# Patient Record
Sex: Male | Born: 2000 | Race: White | Hispanic: No | Marital: Single | Smoking: Never smoker
Health system: Southern US, Community
[De-identification: ages and names within clinical notes are randomized; demographics above are authoritative.]

## PROBLEM LIST (undated history)

## (undated) DIAGNOSIS — J309 Allergic rhinitis, unspecified: Secondary | ICD-10-CM

## (undated) DIAGNOSIS — T7840XA Allergy, unspecified, initial encounter: Secondary | ICD-10-CM

## (undated) DIAGNOSIS — J45909 Unspecified asthma, uncomplicated: Secondary | ICD-10-CM

## (undated) HISTORY — PX: HX WISDOM TEETH EXTRACTION: SHX21

## (undated) HISTORY — DX: Allergy, unspecified, initial encounter: T78.40XA

## (undated) HISTORY — DX: Allergic rhinitis, unspecified: J30.9

## (undated) HISTORY — PX: FINGER SURGERY: SHX640

## (undated) HISTORY — PX: WISDOM TOOTH EXTRACTION: SHX21

## (undated) HISTORY — DX: Unspecified asthma, uncomplicated: J45.909

---
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
## 2022-01-31 ENCOUNTER — Other Ambulatory Visit: Payer: Self-pay

## 2022-01-31 ENCOUNTER — Encounter (INDEPENDENT_AMBULATORY_CARE_PROVIDER_SITE_OTHER): Payer: Self-pay | Admitting: Otolaryngology

## 2022-01-31 ENCOUNTER — Ambulatory Visit (INDEPENDENT_AMBULATORY_CARE_PROVIDER_SITE_OTHER): Payer: BC Managed Care – PPO | Admitting: Otolaryngology

## 2022-01-31 VITALS — BP 122/62 | HR 80 | Temp 96.9°F | Ht 71.26 in | Wt 203.3 lb

## 2022-01-31 DIAGNOSIS — J309 Allergic rhinitis, unspecified: Secondary | ICD-10-CM

## 2022-01-31 DIAGNOSIS — J343 Hypertrophy of nasal turbinates: Secondary | ICD-10-CM

## 2022-01-31 NOTE — Procedures (Signed)
ENT, [facility] ENT  [place] [id number]  [facility]  Procedure Note    Name: Tyler Navarro MRN:  [id number]   Date: 01/31/2022 Age: 21 y.o.       Procedures    Barbette Merino Eh Sauseda, MD

## 2022-01-31 NOTE — Patient Instructions (Signed)
Patient Appointment Instructions:    During your visit, we will be testing for the most common inhalant allergies in our area which include Pollens, Cat, Dog, Dustmites, Molds, and Cockroach.  After your visit, we will be able to  tell you what you are allergic to and how sensitive you are to the allergens.  Please do the following:     Eat a light meal before testing.  Wear a sleeveless shirt for testing.  The test will be performed on both arms.   Do not wear perfume, cologne, aftershave,  or heavily scented deodorant on the day of the test.  Notify us before the test if you are planning to become pregnant.   STOP these medications prior to allergy testing or you will test negative:  Oral Antihistamines - Stop taking 7 days prior to testing.  Examples:  Allegra (Fexofenadine), Claritin (Loratadine), Clarinex (Desioratidine), Zyrtec (Cetirizine), Xyzal, Benadryl (Diphenhydramind), Hydroxyzine (atarax, Vistaril), Chlorpheniramine, Perlactin, Lodrane, Actifed, Dimetapp, Tavist, Phenergan, Nyquil, Alaver, etc.  Antihistamines are found in many over-the-counter cough/cold/allergy/sleep aid medications.  Read the label carefully.  Anti-Nausea and Anti-Vertigo - Stop taking 3 days prior to testing.   Examples:  Compazine, Meclizine, and Phenergan   Antihistamine nasal sprays -  Stop taking 3 days prior to testing.  Examples:  Astelin, Astepro, Pantanase, Dymista  Antihistamine Eye Drops -  Stop taking 3 days prior to testing.  Examples:  Optivar, Pataday, Patanol, Zaditor.  Tricyclic Antidepressants, Psychotropics, and MAO Inhibitors -  Stop taking 1-2 weeks prior to testing.  Important to first check with prescribing doctor before discontinuing these medications.  Examples:  Doxepin, Clomipramine, Amitriptyline, Elavil, Imipramine, Nortriptyline, Remeron, Trazodone, Timipamine, Amoxapine, Serzone, Desipramine, Protriptyline, Thorazine, Haldol, Seroquel, Cogentin, Risperdal.  DO NOT TO STOP THESE MEDICATIONS:  Prednisone  and other steroids  Singulair  Nasal corticosteroid sprays (Flonase, Veramyst, Nasonex, Rhinocort, Beconase, etc.  Asthma medications and inhalers  Afrin, Sudafed, Atrovent nasal spray, Mucinex, Dextromethorphan  Birth control pills and hormones  Antibiotics  H2 antagonists (Zantac, Tagamet, Pepcid, Axid, etc.)  Aspirin  IF YOU ARE NOT SURE ABOUT A DRUG, PLEASE CALL AND ASK BEFORE YOU TAKE IT.  Testing CANNOT be performed if you have any of the following conditions:  You have a sunburn, rash on the arms, or if you are ill  You have chest tightness or wheezing the day of your test  You are taking beta-blockers.  These are medications used to treat high blood pressure, heart conditions, migraines, glaucoma, and other problems.  Examples:  Atenolol, Toprol, Metoprolol, Lopressor, Timoptic, Coreq, Inderal, Propanolol, Betapace, Tenormin, Ziac, Nadolol, Bystolle, etc.  Allow approximately 1-2 hours for allergy testing.  If you are running late, please contact the clinic.  You may need to be rescheduled.  If you have any questions, please call us at [phone].

## 2022-01-31 NOTE — H&P (Signed)
ENT, [facility] E[facility]  [address]  [place] [address]  [phone]    PATIENT NAME:  Tyler Navarro  MRN:  [id number]  DOB:  Oct 04, 2001  DATE OF SERVICE: 01/31/2022    Chief Complaint:  Allergies      HPI:  Tyler Navarro is a 21 y.o. boy, who just started as freshman here at [employer]. He moved from [place], where he had significant allergies for which he had been on allergy shots for 3 years. He had to DC them here in January and he is in here resuming his allergy shots. He does report issues with sinuses but not fairly often. At the present, he is on Allegra, Zyrtec, and Singulair, but not sprays.     Past Medical History:  Past Medical History:   Diagnosis Date   . Allergic rhinitis    . Allergy            Past Surgical History:  Past Surgical History:   Procedure Laterality Date   . FINGER SURGERY     . HX WISDOM TEETH EXTRACTION             Family History:  Family Medical History:       Problem Relation (Age of Onset)    Cancer Maternal Grandmother, Maternal Grandfather, Paternal 60, Paternal Grandfather    Diabetes Father, Sister              Social History:  Social History     Tobacco Use   Smoking Status Never   Smokeless Tobacco Never     Social History     Substance and Sexual Activity   Alcohol Use Yes     Social History     Occupational History   . Not on file       Medications:  Outpatient Medications Marked as Taking for the 01/31/22 encounter (Office Visit) with Leveon Pelzer, Barbette Merino, MD   Medication Sig   . cetirizine (ZYRTEC) 10 mg Oral Tablet Take 1 Tablet (10 mg total) by mouth Once a day   . fexofenadine (ALLEGRA) 180 mg Oral Tablet Take 1 Tablet (180 mg total) by mouth Once a day   . montelukast (SINGULAIR) 10 mg Oral Tablet Take 1 Tablet (10 mg total) by mouth Every evening       Allergies:  No Known Allergies    Review of Systems:  Do you have any fevers: no   Any weight change: no   Change in your vision: no    Chest Pain: no   Shortness of Breath: yes   Stomach  pain: no   Urinary difficulity: no   Joint Pain: no   Skin Problems: no   Weakness or Numbness: no   Easy Bruising or Bleeding: no   Excessive Thirst: no   Seasonal Allergies: yes Explain Seasonal Allergies: spring  nasal congestion  All other systems reviewed and found to be negative.    Physical Exam:  Blood pressure 122/62, pulse 80, temperature 36.1 C (96.9 F), height 1.81 m (5' 11.26"), weight 92.2 kg (203 lb 4.2 oz), SpO2 97 %.  Body mass index is 28.14 kg/m.  General Appearance: Pleasant, cooperative, healthy, and in no acute distress.  Eyes: Conjunctivae/corneas clear.  Head and Face: Face symmetric, no obvious lesions.   External auditory canals:  Patent without inflammation.  Tympanic membranes:  Intact, translucent, midposition, middle ear aerated.  Nose:  See procedure note.  Oral Cavity/Oropharynx: No mucosal lesions, masses, or pharyngeal asymmetry.  Procedure:   Nasal endoscopy after time-out was performed. He does have bilaterally enlarged inferior turbinates. Some crusting and secretions were noted on the right side.     Data Reviewed:  N/A    Assessment:      ICD-10-CM    1. Allergic rhinitis  J30.9 31231 - NASAL ENDOSCOPY DIAGNOSTIC UNILATERAL OR BILATERAL (AMB ONLY)     AMB CONSULT/REFERRAL MENT ONLY - ALLERGY TESTING      2. Nasal turbinate hypertrophy  J34.3 31231 - NASAL ENDOSCOPY DIAGNOSTIC UNILATERAL OR BILATERAL (AMB ONLY)     AMB CONSULT/REFERRAL MENT ONLY - ALLERGY TESTING            Plan:  We will proceed with allergy testing. He is to use steroid spray as well, and if symptoms really worsen, we can proceed with a DepoMedrol injection once he is allergy tested. SNOT equals 19.      Barbette Merino Merary Garguilo, MD

## 2022-02-16 ENCOUNTER — Emergency Department (HOSPITAL_COMMUNITY): Payer: BC Managed Care – PPO

## 2022-02-16 ENCOUNTER — Ambulatory Visit (INDEPENDENT_AMBULATORY_CARE_PROVIDER_SITE_OTHER): Payer: Self-pay | Admitting: OTOLARYNGOLOGY

## 2022-02-16 ENCOUNTER — Ambulatory Visit (INDEPENDENT_AMBULATORY_CARE_PROVIDER_SITE_OTHER): Payer: BC Managed Care – PPO

## 2022-02-16 ENCOUNTER — Other Ambulatory Visit: Payer: Self-pay

## 2022-02-16 ENCOUNTER — Encounter (HOSPITAL_COMMUNITY): Payer: Self-pay

## 2022-02-16 ENCOUNTER — Emergency Department
Admission: EM | Admit: 2022-02-16 | Discharge: 2022-02-16 | Disposition: A | Discharge status: Home / Self Care | Payer: BC Managed Care – PPO | Attending: Emergency Medicine | Admitting: Emergency Medicine

## 2022-02-16 DIAGNOSIS — R000 Tachycardia, unspecified: Secondary | ICD-10-CM | POA: Insufficient documentation

## 2022-02-16 DIAGNOSIS — R21 Rash and other nonspecific skin eruption: Secondary | ICD-10-CM | POA: Insufficient documentation

## 2022-02-16 DIAGNOSIS — J343 Hypertrophy of nasal turbinates: Secondary | ICD-10-CM

## 2022-02-16 DIAGNOSIS — R059 Cough, unspecified: Secondary | ICD-10-CM | POA: Insufficient documentation

## 2022-02-16 DIAGNOSIS — J309 Allergic rhinitis, unspecified: Secondary | ICD-10-CM

## 2022-02-16 DIAGNOSIS — T7840XA Allergy, unspecified, initial encounter: Secondary | ICD-10-CM | POA: Insufficient documentation

## 2022-02-16 MED ORDER — IPRATROPIUM 0.5 MG-ALBUTEROL 3 MG (2.5 MG BASE)/3 ML NEBULIZATION SOLN
3.0000 mL | INHALATION_SOLUTION | RESPIRATORY_TRACT | Status: AC
Start: 2022-02-16 — End: 2022-02-16
  Administered 2022-02-16: 3 mL via RESPIRATORY_TRACT

## 2022-02-16 MED ORDER — DEXAMETHASONE SODIUM PHOSPHATE (PF) 10 MG/ML INJECTION SOLUTION
10.0000 mg | INTRAMUSCULAR | Status: AC
Start: 2022-02-16 — End: 2022-02-16
  Administered 2022-02-16: 10 mg via INTRAVENOUS
  Filled 2022-02-16: qty 1

## 2022-02-16 MED ORDER — EPINEPHRINE 0.3 MG/0.3 ML INJECTION, AUTO-INJECTOR
0.3000 mg | AUTO-INJECTOR | Freq: Once | INTRAMUSCULAR | 0 refills | Status: DC | PRN
Start: 2022-02-16 — End: 2022-03-07

## 2022-02-16 MED ORDER — FAMOTIDINE (PF) 20 MG/2 ML INTRAVENOUS SOLUTION
20.0000 mg | INTRAVENOUS | Status: AC
Start: 2022-02-16 — End: 2022-02-16
  Administered 2022-02-16: 20 mg via INTRAVENOUS
  Filled 2022-02-16: qty 2

## 2022-02-16 MED ORDER — DIPHENHYDRAMINE 50 MG/ML INJECTION SOLUTION
25.0000 mg | INTRAMUSCULAR | Status: AC
Start: 2022-02-16 — End: 2022-02-16
  Administered 2022-02-16: 25 mg via INTRAVENOUS
  Filled 2022-02-16: qty 1

## 2022-02-16 NOTE — Discharge Instructions (Signed)
Please return to the ED for any new or worsening symptoms, particularly worsening rash, difficulty breathing, facial swelling, vomiting, or any other concerning symptoms.

## 2022-02-16 NOTE — ED Nurses Note (Signed)
Verbal report given to RN. Care of patient transferred at this time.

## 2022-02-16 NOTE — ED Nurses Note (Signed)
Security called to transport pt to [address] to get pt car at allergy center.

## 2022-02-16 NOTE — Telephone Encounter (Signed)
Pt seen in clinic today for allergy testing. After second round of allergy testing, pt appeared to have hives on trunk. LPN informed RN. RN auscultated lungs at 220pm during which patient was noted to have wheezing in the upper left lobe.     Dr. Katherine Basset informed and VS taken. VS 116/78, 97% O2, HR 89. Dr. Katherine Basset ordered Epi 0.3mg /0.82ml to be administered. Epi administered by RN IM in the upper right deltoid. Pt tolerated administration.     At 230pm, VS rechecked. HR 136/70, HR 97, Oxygen 99%. Pt monitored in clinic by RN and physician. No worsening symptoms noted. 911 called.    EMS arrived on scene 233. BP 124/80.    Pt transported from scene 240pm. Care transferred.

## 2022-02-16 NOTE — ED Attending Note (Signed)
I saw and examined the patient.  I directly supervised the patient's care.  I discussed the patient's care with the resident/APP and I agree with the findings, diagnosis, and plan as documented in the primary note.  Any exceptions, additions, or corrections are noted below.    MDM - re-evaluated pt, reviewed relevant old records, hemo monitoring    Problems considered: cardiovascular, infectious, inflammatory, neurologic, gastrointestinal, immune, pharmacologic, endocrine    Allergic reaction without dyspnea, airway swelling, vomiting  ED obs  No recurrent sx  OK for DC, supportive care, ret inst

## 2022-02-16 NOTE — Progress Notes (Cosign Needed)
02/16/22 1400   Allergy Nurses Notes   Patient Denies Use Of Antihistamine;Beta Blockers;Steroids;Antidepressants   Patient tolerated procedure well   (Seen RN note.)   Benadryl given PO after skin testing for itching and redness   (no)   Hydrocortisone cream applied to testing area yes   Initials ss      02/16/22 1400   BATTERY A   Testing Site A Left forearm   Initial/Retest Initial   Histamine (mm) 9 mm   Dustmite Farinae (mm) 0 mm   Cat (mm) 0 mm   Dog (mm) 0 mm   Cockroach (mm) 5 mm   Alternaria (mm) 5 mm   Cladosporium Sphaerospermum (mm) 0 mm   Saline (mm) 0 mm   Battery A Single/Multiple: 6   BATTERY B   Testing Site B Left forearm   Initial/Retest Initial   Elm (mm) 9 mm   Red Oak (mm) 9 mm   American Sycamore (mm) 0 mm   Timothy Grass (mm) 7 mm   Johnson Grass (mm) 7 mm   Ragweed (mm) 5 mm   English Plantain (mm) 7 mm   Dustmite Pteronyssinus (mm) 5 mm   Battery B Single/Multiple: 8   BATTERY C   Testing Site C Right forearm   Initial/Retest Initial   Birch Red (mm) 9 mm   Maple (mm) 9 mm   Guatemala Grass (mm) 9 mm   Lamb Quarters (mm) 7 mm   Rough Pigweed (mm) 7 mm   Bipolaris Sorokiniana (mm) 5 mm   Aspergillus (mm) 0 mm   Penicillium (mm) 0 mm   Initials ss   Battery C Single/Multiple: 8      02/16/22 1400   Site    Testing site 1 Left Side   Ragweed   Ragweed Dilution 6 5   Ragweed Dilution 4 11   TOTAL RAGWEED DILUTION @ 5   Ragweed Total Single/Multiple Stick: 2   Pigweed   Pigweed Dilution 6 5   Pigweed Dilution 4 9   TOTAL PIGWEED DILUTION @ 5   Pigweed Total Single/Multiple Stick: 2   Plantain   Plantain Dilution 6 5   Plantain Dilution 4 9   TOTAL PLANTAIN DILUTION @ 5   Plantain Total Single/Multiple Stick: 2   Lamb's Quarters   Lamb's Quarters Dilution 4 7   Lamb's Quarters Dilution 3 9   TOTAL LAMB'S QUARTERS DILUTION @ 4   Lamb's Quarters Total Single/Multiple Stick: 2   Timothy Grass   Timothy Grass Dilution 6 7   Timothy Grass Dilution 4 11   TOTAL TIMOTHY GRASS DILUTION @ 6   Timothy  Grass Total Single/Multiple Stick: 2   Guatemala Grass   Guatemala Grass Dilution 6 7   Guatemala Grass Dilution 5 9   TOTAL [place] GRASS DILUTION @ 6   Guatemala Grass Total Single/Multiple Stick: 2   Johnson Grass   Johnson Grass Dilution 6 5   Johnson Grass Dilution 4 9   TOTAL JOHNSON GRASS DILUTION @ 5   Johnson Grass Total Single/Multiple Stick: 2   Red Oak   [employer]ant  Dilution 6 5   Red Oak  Dilution 4 9   TOTAL RED OAK DILUTION @ 5   Red Oak Total Single/Multiple Stick: 2   Maple (Box Elder)   Maple Dilution 6 5   Maple Dilution 4 9   TOTAL MAPLE DILUTION @ 5   Maple Total Single/Multiple Stick: 2   RED BIRCH  Red Birch Dilution 6 5   Red Birch Dilution 4 13   TOTAL RED BIRCH DILUTION   @ 5   Red Birch Total Single/Multiple Stick: 2   American Automotive engineer Dilution 6   (Did not get to test, see RN note.)   American Sycamore Dilution 4 9   American Sycamore Dilution 2 11   American Sycamore Total Single/Multiple Stick: 2   Cat   Cat Dilution 2 5   Cat Total Single/Multiple Stick: 1   Dog   Dog Dilution 2 5   Dog Total Single/Multiple Stick: 1   SITE   Testing site 2 Right Side   Dustmite Farinae   Dustmite Farinae Dilution 2 7   Dustmite Farinae Dilution 1 11   TOTAL DUSTMITE FARINAE DILUTION @ 2   Dustmite Farinae Total Single/Multiple Stick: 2   Dustmite Pteronyssinus   Dustmite Pteronyssinus Dilution 4 7   Dustmite Pteronyssinus Dilution 3 9   TOTAL DUSTMITE PTERONYSSINUS DILUTION @ 4   Dustmite Pteronyssinus Total Single/Multiple Stick: 2   Aspergillus   Aspergillus Dilution 2 5   Aspergillus Total Single/Multiple Stick: 1   Alternaria   Alternaria Dilution 4 7   Alternaria Dilution 3 9   TOTAL ALTERNARIA DILUTION @ 2   Alternaria Total Single/Multiple Stick: 2   Cladosporium Sphaerospermum   Cladosporium Sphaerospermum Dilution 2 5   Cladosporium Sphaerospermum Total Single/Multiple Stick: 1   Penicillium   Penicillium Dilution 2 5   Penicillium Total Single/Multiple Stick: 1   Bipolaris  Sorokinina   Bipolaris Sorokiniana Dilution 4 5   Bipolaris Sorokiniana Dilution 2 9   TOTAL BIPOLARIS SOROKINIANA  @3    Bipolaris Sorokiniana Total Single/Multiple Stick: 2   Cockroach   Cockroach Dilution 4 5   Cockroach Dilution 2 7   Cockroach Dilution 1   (Did not get to test, see RN note.)   Cockroach Total Single/Multiple Stick: 2   Glycerin   Glycerin Class 2   (#1 - 7 , #2 - 5 , #3 - 5)   Initials   Initials ss     See RN note.   Dr.Ramadan to be made aware of test results and patient's reaction.    Camie Patience, LPN  [id number], [id number]    Patient started to develop anaphylaxis after testing, so epinephrine was administered at my direction (see Nursing note for more details.). EMS was called and patient transported to the ED.    Francia Greaves, MD

## 2022-02-16 NOTE — ED Nurses Note (Signed)
Pt arrived to ED 30 via EMS after receiving an allergy panel and started to break out in hives on arms and chest. Pt denies face, tongue, or throat swelling at that time. Pt endorses cough that started while receiving the allergy testing. Pt received 0.3mg  of epi at facility pta at 1425. Pt states the hives have gone down compared to early. No hives noted on chest upon assessment. Pt denies chest pain and SOB. ED provider at bedside.

## 2022-02-16 NOTE — ED Provider Notes (Signed)
[facility] - Emergency Department  ED Primary Provider Note  History of Present Illness   Chief Complaint   Patient presents with   . Allergic reaction     Was receiving allergy panel and started to break out in hives. Received 0.3mg  epi in right arm at 1425 by facility staff pta  Wheezing was noted by EMS     Tyler Navarro is a 21 y.o. male who had concerns including Allergic reaction.  Arrival: The patient arrived by Ambulance    HPI     Patient reports he was receiving an allergy panel today at around 13:00 today when he had onset of hives to his abdomen, chest, and upper extremities at around 14:00. He reports he was advised to discontinue his daily medications for one week prior to the study. Pt states his daily medications include allegra, zyrtec, and Singulair. He reports that throughout that the week, he has noticed a mild rash to his chest as well as a cough. He notes his rash worsened significantly once he began the testing. Patient reports he does not have an epi pen at home and denies any history of anaphylactic reactions. Per charting, patient received 0.3 of epi at 14:30. At this time, pt reports he feels his heart racing, but denies any SOB, chest tightness, abdominal pain, nausea, vomiting, diarrhea, face swelling, or other complaints or concerns at this time.    Pertinent positive and negative ROS as per HPI.  Historical Data   History Reviewed This Encounter:      Physical Exam   ED Triage Vitals [02/16/22 1501]   BP (Non-Invasive) (!) 170/90   Heart Rate 94   Respiratory Rate 18   Temperature 37.1 C (98.8 F)   SpO2 100 %   Weight    Height      Physical Exam  Vitals and nursing note reviewed.   Constitutional:       General: He is not in acute distress.     Appearance: He is well-developed.   HENT:      Head: Normocephalic and atraumatic.   Eyes:      Conjunctiva/sclera: Conjunctivae normal.   Cardiovascular:      Rate and Rhythm: Normal rate and regular rhythm.      Heart  sounds: No murmur heard.  Pulmonary:      Effort: Pulmonary effort is normal. No respiratory distress.      Comments: Trace end-expiratory wheezing  Abdominal:      Palpations: Abdomen is soft.      Tenderness: There is no abdominal tenderness.   Musculoskeletal:         General: No swelling.      Cervical back: Neck supple.   Skin:     General: Skin is warm and dry.      Capillary Refill: Capillary refill takes less than 2 seconds.   Neurological:      Mental Status: He is alert.   Psychiatric:         Mood and Affect: Mood normal.       Patient Data   Labs Ordered/Reviewed - No data to display  No orders to display     Medical Decision Making        Medical Decision Making  Allergic reaction, initial encounter: acute illness or injury  Risk  Prescription drug management.        ED Course as of 02/17/22 0003   Thu Feb 16, 2022   1513 Patient  presents with rash while receiving allergy test.  States that he had severe redness on his chest but no facial swelling or shortness of breath.  No vomiting or abdominal pain.  He was given epi and had improvement in his rash.  Comfortable in the room at this time.  Did have some trace wheezing bilaterally.  Will treat with breathing treatment as well as steroids, Pepcid, Benadryl and observe for 1 hour   1615 Patient re-evaluated.  Continues to be clinically stable and comfortable in the room.  No shortness of breath.  No recurrent rash.  No abdominal pain or facial swelling.  Appropriate for discharge home.  Two epi pens were prescribed and patient was given instructions on how to use them.  Instructed to Use Benadryl and Pepcid for any further rash and only use EpiPen as necessary.  All questions answered.  Discharged home in stable condition.   1616 BP (Non-Invasive): 127/71   1616 Heart Rate: 93   1616 SpO2: 100 %   1616 Temperature: 37.1 C (98.8 F)         Medications Administered in the ED   famotidine (PEPCID) 10 mg/mL injection (20 mg Intravenous Given 02/16/22 1530)    diphenhydrAMINE (BENADRYL) 50 mg/mL injection (25 mg Intravenous Given 02/16/22 1529)   dexamethasone (PF) 10 mg/mL injection (10 mg Intravenous Given 02/16/22 1530)   ipratropium-albuterol 0.5 mg-3 mg(2.5 mg base)/3 mL Solution for Nebulization (3 mL Nebulization Given 02/16/22 1530)     Clinical Impression   Allergic reaction, initial encounter (Primary)       Disposition: Discharged           I am scribing for, and in the presence of, [name], MD for services provided on 02/17/2022.   [name], SCRIBE    I personally performed the services described in this documentation, as scribed  in my presence, and it is both accurate  and complete.    Irene Pap, MD/  PGY-2 Emergency Medicine  [facility]  02/17/22 00:03

## 2022-02-21 ENCOUNTER — Ambulatory Visit (INDEPENDENT_AMBULATORY_CARE_PROVIDER_SITE_OTHER): Payer: BC Managed Care – PPO | Admitting: Otolaryngology

## 2022-02-21 ENCOUNTER — Other Ambulatory Visit: Payer: Self-pay

## 2022-02-21 VITALS — BP 132/64 | HR 76 | Temp 96.8°F | Ht 71.26 in | Wt 199.3 lb

## 2022-02-21 DIAGNOSIS — J309 Allergic rhinitis, unspecified: Secondary | ICD-10-CM

## 2022-02-21 DIAGNOSIS — J019 Acute sinusitis, unspecified: Secondary | ICD-10-CM

## 2022-02-21 DIAGNOSIS — J343 Hypertrophy of nasal turbinates: Secondary | ICD-10-CM

## 2022-02-21 MED ORDER — CEFDINIR 300 MG CAPSULE
300.0000 mg | ORAL_CAPSULE | Freq: Two times a day (BID) | ORAL | 0 refills | Status: AC
Start: 2022-02-21 — End: 2022-03-03

## 2022-02-21 NOTE — Progress Notes (Signed)
ENT, [facility]  [address]  [place] [address]  [phone]    PATIENT NAME:  [facility]  MRN:  [id number]  DOB:  Jul 26, 2001  DATE OF SERVICE: 02/21/2022    Chief Complaint:  Allergies      HPI:  Tyler [facility]arro is a 21 y.o. male presenting in here today for follow-up on his allergic rhinitis. 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 He did go through testing last time. Apparently he had a significant reaction with rash, for which EpiPen was given, and then sent to the ED for observation whereby no management was done, and he did well. He is in here today. He seems to be doing good. He is stuffy, congested as before.     Medications:  Outpatient Medications Marked as Taking for the 02/21/22 encounter (Office Visit) with Anadelia Kintz, Barbette Merino, MD   Medication Sig   . cefdinir (OMNICEF) 300 mg Oral Capsule Take 1 Capsule (300 mg total) by mouth Twice daily for 10 days   . cetirizine (ZYRTEC) 10 mg Oral Tablet Take 1 Tablet (10 mg total) by mouth Once a day   . fexofenadine (ALLEGRA) 180 mg Oral Tablet Take 1 Tablet (180 mg total) by mouth Once a day   . montelukast (SINGULAIR) 10 mg Oral Tablet Take 1 Tablet (10 mg total) by mouth Every evening       Allergies:  Allergies   Allergen Reactions   . Grass Pollen    . House Dust    . Tree And Shrub Pollen          Physical Exam:  Blood pressure 132/64, pulse 76, temperature 36 C (96.8 F), height 1.81 m (5' 11.26"), weight 90.4 kg (199 lb 4.7 oz), SpO2 97 %.  Body mass index is 27.59 kg/m.  General Appearance: Pleasant, cooperative, healthy, and in no acute distress.  Eyes: Conjunctivae/corneas clear.  Head and Face: Face symmetric, no obvious lesions.   External auditory canals:  Patent without inflammation.  Tympanic membranes:  Intact, translucent, midposition, middle ear aerated.  Nose: I was able to see purulence on the left side.   Oral Cavity/Oropharynx: No mucosal lesions,  masses, or pharyngeal asymmetry.    Procedure:        Data Reviewed:    Testing was reviewed.     Assessment:      ICD-10-CM    1. Allergic rhinitis  J30.9       2. 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 Acute sinusitis  J01.90            Plan:  I did discuss with him risks of shots. He wants to proceed. We are going to proceed gently with him. We are going to proceed with shots at his endpoints rather than doing 2 above, and I am going to exclude Guatemala grass and Sycamore grass for the time being. I am going to give him Omnicef for the sinusitis, and I will see him back for follow-up in 3 months. SNOT equals 26.     Barbette Merino See Beharry, MD

## 2022-02-24 ENCOUNTER — Ambulatory Visit (INDEPENDENT_AMBULATORY_CARE_PROVIDER_SITE_OTHER): Payer: Self-pay | Admitting: Otolaryngology

## 2022-02-24 NOTE — Telephone Encounter (Signed)
I attempted to call patient to discuss allergy injections per Dr. Ramadan. No response. Left message on voicemail for patient to return call at provided phone number.

## 2022-02-27 ENCOUNTER — Ambulatory Visit (INDEPENDENT_AMBULATORY_CARE_PROVIDER_SITE_OTHER): Payer: Self-pay | Admitting: Otolaryngology

## 2022-02-27 NOTE — Telephone Encounter (Signed)
Spoke to patient. Asked if patient would be interested in starting injections. Patient agreeable. Informed patient of Epipen policy. Advised that a valid Epipen would need to be brought to every injections. Also advised that after every injection until concentration is met, patient will have to wait 20 minutes to ensure no negative reactions. Patient agreeable and would like to proceed.     Asked the patient the following questions. Please see responses:    1.) Is the patient on a beta blocker? No  2.) Does the patient have a history of anaphylaxis? Yes- to allergy testing  3.) Does the patient have a history of asthma? If so, is it well controlled? Exercise induced. No issues in years per pt      Will mix patient 03/07/2022.

## 2022-03-02 ENCOUNTER — Other Ambulatory Visit (INDEPENDENT_AMBULATORY_CARE_PROVIDER_SITE_OTHER): Payer: Self-pay | Admitting: Otolaryngology

## 2022-03-02 MED ORDER — EPINEPHRINE 0.3 MG/0.3 ML INJECTION, AUTO-INJECTOR
0.3000 mg | AUTO-INJECTOR | Freq: Once | INTRAMUSCULAR | 2 refills | Status: DC | PRN
Start: 2022-03-02 — End: 2022-03-07

## 2022-03-02 NOTE — Telephone Encounter (Signed)
Regarding: Ramadan  ----- Message from Lars Pinks sent at 03/02/2022  1:32 PM EDT -----  Patient needing his script for EPINEPHrine 0.3 mg/0.3 mL Injection [facility]        [facility]erred [facility]     [facility]   [phone]

## 2022-03-02 NOTE — Telephone Encounter (Signed)
Routed Epipen to Dr. Ramadan.

## 2022-03-06 ENCOUNTER — Other Ambulatory Visit (INDEPENDENT_AMBULATORY_CARE_PROVIDER_SITE_OTHER): Payer: Self-pay | Admitting: Otolaryngology

## 2022-03-06 DIAGNOSIS — J309 Allergic rhinitis, unspecified: Secondary | ICD-10-CM

## 2022-03-06 DIAGNOSIS — J3089 Other allergic rhinitis: Secondary | ICD-10-CM

## 2022-03-07 ENCOUNTER — Other Ambulatory Visit (INDEPENDENT_AMBULATORY_CARE_PROVIDER_SITE_OTHER): Payer: Self-pay | Admitting: Otolaryngology

## 2022-03-07 MED ORDER — EPINEPHRINE 0.3 MG/0.3 ML INJECTION, AUTO-INJECTOR
0.3000 mg | AUTO-INJECTOR | Freq: Once | INTRAMUSCULAR | 2 refills | Status: DC | PRN
Start: 2022-03-07 — End: 2023-11-15

## 2022-03-07 NOTE — Telephone Encounter (Signed)
Script routed to provider for approval.

## 2022-03-07 NOTE — Telephone Encounter (Signed)
Attempted to call patient. No response. Unable to leave VM due to automated message

## 2022-03-07 NOTE — Telephone Encounter (Signed)
Regarding: [facility]  ----- Message from Fredderick Severance sent at 03/07/2022  1:18 PM EDT -----  Pt called and his script//EPINEPHrine 0.3 mg/0.3 mL Injection Auto-Injector was called into [place] pharmacy// His Inj is on Thursday      [id number] [address] [place] [address]  Phone: [phone] Fax: [phone]  Hours: Not open 24 hours        ----- Message from Namon Cirri sent at 03/02/2022  1:32 PM EDT -----  Patient needing his script for EPINEPHrine 0.3 mg/0.3 mL Injection [facility].        Preferred Pharmacy     [facility]   [phone]

## 2022-03-08 ENCOUNTER — Other Ambulatory Visit (INDEPENDENT_AMBULATORY_CARE_PROVIDER_SITE_OTHER): Payer: BC Managed Care – PPO | Admitting: Otolaryngology

## 2022-03-08 DIAGNOSIS — J301 Allergic rhinitis due to pollen: Secondary | ICD-10-CM

## 2022-03-08 DIAGNOSIS — J3089 Other allergic rhinitis: Secondary | ICD-10-CM

## 2022-03-09 ENCOUNTER — Ambulatory Visit (INDEPENDENT_AMBULATORY_CARE_PROVIDER_SITE_OTHER): Payer: BC Managed Care – PPO

## 2022-03-09 ENCOUNTER — Other Ambulatory Visit: Payer: Self-pay

## 2022-03-09 DIAGNOSIS — Z516 Encounter for desensitization to allergens: Secondary | ICD-10-CM

## 2022-03-09 DIAGNOSIS — J301 Allergic rhinitis due to pollen: Secondary | ICD-10-CM | POA: Insufficient documentation

## 2022-03-09 NOTE — Nursing Note (Signed)
03/09/22 1000   Mixed Vials   Comments Pt did not have epipen. [facility]ated [facility] 6pm tongiht. Pt made aware epipen must be present to administer injections. Pt will come back next thursday.

## 2022-03-09 NOTE — Nursing Note (Signed)
03/08/22 1136   ENT Immunotherapy Vial Preparation Flowsheet   Allergy Test Date 02/16/22   Immunotherapy Start Date   (not started yet)   Type of Immunotherapy? Subcutaneous   Managing Physician Imad Shostak   Initials cs   Vial #1   Preparation Date 03/08/22   Expiration Date 06/07/22   Immunotherapy Status No escalation   Diagnosis Allergic Rhinitis due to pollen (J30.1)   Ragweed, Short 1:20 W/V Lavella Hammock)   Ragweed, Short Initial Endpoint 5   Dilution (10% Glycerin) Used 5   Volume of Dilution Used 0.39ml   Pigweed, Rough/Redroot 1:20 W/V Lavella Hammock)   Pigweed, Rough/Redroot Initial Endpoint 5   Dilution (10% Glycerin) Used 5   Volume of Dilution Used 0.5ml   Plantain, English 1:20 W/V Lavella Hammock)   Plantain, English Initial Endpoint 5   Dilution (10% Glycerin) Used 5   Volume of Dilution Used 0.53ml   Lambs Quarter 1:20 W/V (Greer)   Lambs Quarter Initial Endpoint 4   Dilution (10% Glycerin) Used 4   Volume of Dilution Used 0.38ml   Timothy Grass 100,000 BAU/ML Lavella Hammock)   Timothy Grass Initial Endpoint 6   Dilution (10% Glycerin) Used 6   Volume of Dilution Used 0.86ml   Guatemala Grass 10,000 BAU/ML Lavella Hammock)   Guatemala Grass Initial Endpoint 6   Dilution (10% Glycerin) Used 6   Volume of Dilution Used 0.29ml   Johnson Grass 1:20 W/V Lavella Hammock)   Johnson Grass Initial Endpoint 5   Dilution (10% Glycerin) Used 5   Volume of Dilution Used 0.71ml   Red Oak 1:20 W/V (Greer)   Red Oak Initial Endpoint 5   Dilution (10% Glycerin) Used 5    Volume of Dilution Used 0.62ml   Box Elder 1:20 W/V Lavella Hammock)   Box Elder Initial Endpoint 5   Dilution (10% Glycerin) Used 5    Volume of Dilution Used 0.75ml   Birch, River 1:20 W/V (Greer)   Wimbledon, River Initial Endpoint 5   Dilution (10% Glycerin) Used 5    Volume of Dilution Used 0.35ml   Sycamore, American Eastern 1:20 W/V (Greer)   [place], American Russian Federation Initial Endpoint 5   Dilution (10% Glycerin) Used 5   Volume of Dilution Used 0.69ml   OTHER   Antigen Volume 2.2   Diluent (Normal Saline)  Volume 2.8   Total Volume 65ml   Antigen Volume 0.8   Diluent (Normal Saline) Volume 4.2   Total Volume 30ml   Vial #2   Preparation Date 03/08/22   Expiration Date 06/07/22   Immunotherapy Status No escalation   Diagnosis Allergic Rhinitis due to dustmite (J30.89);Allergic Rhinitis due to mold (J30.89)   Dustmite Farinae  10,000 AU/ML (GREER)   Dustmite Farinae Initial Endpoint 2    Dilution (10% Glycerin) Used 2   Volume of Dilution Used 0.71ml   Dustmite Pteronyssinus 10,000 AU/ML Lavella Hammock)   Dustmite Pteronyssinus Initial Endpoint 4    Dilution (10% Glycerin) Used 4   Volume of Dilution Used 0.39ml   Alternaria 1:20 W/V (Greer)   Alternaria Initial Endpoint 4    Dilution (10% Glycerin) Used 4   Volume of Dilution Used 0.15ml   Bipoloris Sorokiniana 1:20 W/V (Greer)    Bipoloris Sorokiniana Initial Endpoint 3   Dilution (10% Glycerin) Used 3   Volume of Dilution Used 0.39ml     2 (10 unit) vials mixed.

## 2022-03-16 ENCOUNTER — Ambulatory Visit (INDEPENDENT_AMBULATORY_CARE_PROVIDER_SITE_OTHER): Payer: BC Managed Care – PPO

## 2022-03-16 ENCOUNTER — Other Ambulatory Visit: Payer: Self-pay

## 2022-03-16 DIAGNOSIS — Z91038 Other insect allergy status: Secondary | ICD-10-CM

## 2022-03-16 DIAGNOSIS — J3089 Other allergic rhinitis: Secondary | ICD-10-CM

## 2022-03-16 DIAGNOSIS — Z516 Encounter for desensitization to allergens: Secondary | ICD-10-CM

## 2022-03-16 DIAGNOSIS — J301 Allergic rhinitis due to pollen: Secondary | ICD-10-CM

## 2022-03-16 NOTE — Nursing Note (Signed)
03/16/22 1000 03/16/22 1028   Mixed Vials   Allergy Vial #1 03/16/22  --    Test Wheal Vial #1 wheal  --    Comments 18mm  (mixed [id number])  --    Allergy Vial #2  --  03/16/22   Test Ames Coupe Vial #2  --  wheal   Comments  --  2mm   Comments NAV  --    Allergy Injection Flowsheet   Date of Injection 03/16/22 03/16/22   Any Illness/ Fever in Last 24 hours?* No No   Any Change in Medication? No No   Is the patient on a beta blocker? No No   Does the patient have a valid EpiPen at visit? Yes Yes   Does patient have a history of Asthma? 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 No No   VIAL   Comments  --  10/2023       I have reviewed the above allergy vial safety test, give injection.  Pincus Sanes, APRN,NP-C  Pincus Sanes, APRN,NP-C  03/16/2022, 15:09

## 2022-03-17 ENCOUNTER — Ambulatory Visit (INDEPENDENT_AMBULATORY_CARE_PROVIDER_SITE_OTHER): Payer: Self-pay | Admitting: Otolaryngology

## 2022-03-17 NOTE — Telephone Encounter (Signed)
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 He said he spoke with someone back home who is willing to do the injections. I said I would check with you and he was going to provide all the information when he comes back in next week.     Cristy       Pt to bring in information next week. OK to send vials to outside clinic. Pt must come back to clinic for vial test on NAVs.

## 2022-03-20 ENCOUNTER — Telehealth: Payer: Self-pay

## 2022-03-20 NOTE — Telephone Encounter (Signed)
Just a FYI. ?Patient's mom called to see if we would be willing to let Tracy [name] bring his vials that had to be made with a different allergy provider near his school. Patient was unable to successfully get his allergy shots we sent with him @ the school student [facility] due to them closing down. The practice near his school wouldn't take our vials, so Tracy [name] had to re do his allergy testing and allow their office to make his new vials. He did test negative to things that he used to be allergic to so the vials have changed some.  ? ?I requested [name] to have their office send over a form for our office to fill out if need be, to allow our office to do the injections. I also requested we have his shot records/ingredients in the vials.  ?

## 2022-03-21 NOTE — Telephone Encounter (Signed)
No problem at all.  Kamya Watling, MD [facility]  

## 2022-03-23 ENCOUNTER — Other Ambulatory Visit: Payer: Self-pay

## 2022-03-23 ENCOUNTER — Ambulatory Visit (INDEPENDENT_AMBULATORY_CARE_PROVIDER_SITE_OTHER): Payer: BC Managed Care – PPO

## 2022-03-23 DIAGNOSIS — Z91038 Other insect allergy status: Secondary | ICD-10-CM

## 2022-03-23 DIAGNOSIS — J301 Allergic rhinitis due to pollen: Secondary | ICD-10-CM

## 2022-03-23 DIAGNOSIS — J3089 Other allergic rhinitis: Secondary | ICD-10-CM

## 2022-03-23 NOTE — Nursing Note (Signed)
03/23/22 1000 03/23/22 1034   Allergy Injection Flowsheet   Date of Injection 03/23/22 03/23/22   Any Illness/ Fever in Last 24 hours?* No No   Any Change in Medication? No No   Is the patient on a beta blocker? No No   Does the patient have a valid EpiPen at visit? Yes Yes   Does patient have a history of Asthma? 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 No No   VIAL   Comments  --  10/2023     Allena Katz, Ambulatory Care Assistant  03/23/2022, 10:36

## 2022-03-30 ENCOUNTER — Ambulatory Visit (INDEPENDENT_AMBULATORY_CARE_PROVIDER_SITE_OTHER): Payer: BC Managed Care – PPO

## 2022-03-30 ENCOUNTER — Other Ambulatory Visit: Payer: Self-pay

## 2022-03-30 DIAGNOSIS — J301 Allergic rhinitis due to pollen: Secondary | ICD-10-CM

## 2022-03-30 DIAGNOSIS — J3089 Other allergic rhinitis: Secondary | ICD-10-CM

## 2022-03-30 DIAGNOSIS — Z91038 Other insect allergy status: Secondary | ICD-10-CM

## 2022-03-30 NOTE — Nursing Note (Signed)
03/30/22 1000 03/30/22 1003   Mixed Vials   Comments  --  per Alesia Banda, pt can hand carry vials to outside clinic for the summer   Allergy Injection Flowsheet   Date of Injection 03/30/22 03/30/22   Any Reaction to Previous Administration? No No   Any Illness/ Fever in Last 24 hours?* No No   Any Change in Medication? No No   Is the patient on a beta blocker? No No   Does the patient have a valid EpiPen at visit? Yes Yes   Does patient have a history of Asthma? 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 No No   VIAL   Comments [id number]  --

## 2022-03-31 NOTE — Telephone Encounter (Signed)
Left a message for patient or mom to call the office in regards to this matter. ?

## 2022-04-03 ENCOUNTER — Ambulatory Visit (INDEPENDENT_AMBULATORY_CARE_PROVIDER_SITE_OTHER): Payer: Self-pay | Admitting: Otolaryngology

## 2022-04-03 NOTE — Telephone Encounter (Signed)
Patients mom called back and the patient gave the allergy provider in [place] a call today. They are going to send over the paperwork today or tomorrow to be signed by Dr Dellis Anes for consent. I have been on the lookout here in [place].  ?I did put him down for Friday as he has the vials and the shot records with him.  ? ? ?

## 2022-04-03 NOTE — Telephone Encounter (Signed)
-----   Message from Robynn Pane, RN sent at 04/03/2022  2:20 PM EDT -----  Regarding: FW: Ramadan  ----- Message from Gardiner Fanti sent at 04/03/2022  2:17 PM EDT -----    Regarding allergy vials. Patient is calling because we sent a consent form to [place] but they have not received it. Please refax it to [phone]

## 2022-04-03 NOTE — Telephone Encounter (Signed)
Pt requesting consent for outside clinic to administer injections. Will send consent in AM. Pt is agreeable to plan.

## 2022-04-03 NOTE — Telephone Encounter (Signed)
Spoke to patient, states that [facility] sent over a consent this AM for the clinic to administer injections and they did not receive. Will send message to [facility]

## 2022-04-03 NOTE — Telephone Encounter (Signed)
Regarding: Ramadan  ----- Message from Gardiner Fanti sent at 04/03/2022  2:17 PM EDT -----    Regarding allergy vials. Patient is calling because we sent a consent form to [place] [facility] but they have not received it. Please refax it to [phone]

## 2022-04-04 NOTE — Telephone Encounter (Signed)
Faxed letter to fax number provided. Pt aware that letter was to be faxed this AM.

## 2022-04-05 NOTE — Telephone Encounter (Signed)
We have received letter giving permission to administer allergy injections from another office. Letter has been placed in bulk scanning and patient made aware. ?

## 2022-04-07 ENCOUNTER — Ambulatory Visit (INDEPENDENT_AMBULATORY_CARE_PROVIDER_SITE_OTHER): Payer: BC Managed Care – PPO

## 2022-04-07 DIAGNOSIS — J309 Allergic rhinitis, unspecified: Secondary | ICD-10-CM | POA: Diagnosis not present

## 2022-04-07 NOTE — Progress Notes (Signed)
Immunotherapy ? ? ?Patient Details  ?Name: Tracy [name] ?MRN: [id number] ?Date of Birth: [birth year] ? ?04/07/2022 ? ?Alveda Reasons started injections for  Pollen, Dust mite, cockroach, and mite. He transferred his vials from [facility] medicine.  ?Following schedule: A  ?Frequency:1 time per week per his paperwork every seven to ten days.  ?Epi-Pen:Epi-Pen Available  ?Consent signed and patient instructions given. ? ? ?Ralene Muskrat ?04/07/2022, 3:40 PM ? ? ?

## 2022-04-19 ENCOUNTER — Ambulatory Visit (INDEPENDENT_AMBULATORY_CARE_PROVIDER_SITE_OTHER): Payer: BC Managed Care – PPO

## 2022-04-19 DIAGNOSIS — J309 Allergic rhinitis, unspecified: Secondary | ICD-10-CM | POA: Diagnosis not present

## 2022-04-28 ENCOUNTER — Ambulatory Visit (INDEPENDENT_AMBULATORY_CARE_PROVIDER_SITE_OTHER): Payer: BC Managed Care – PPO

## 2022-04-28 DIAGNOSIS — J309 Allergic rhinitis, unspecified: Secondary | ICD-10-CM | POA: Diagnosis not present

## 2022-05-03 ENCOUNTER — Ambulatory Visit (INDEPENDENT_AMBULATORY_CARE_PROVIDER_SITE_OTHER): Payer: BC Managed Care – PPO

## 2022-05-03 DIAGNOSIS — J309 Allergic rhinitis, unspecified: Secondary | ICD-10-CM | POA: Diagnosis not present

## 2022-05-08 ENCOUNTER — Other Ambulatory Visit: Payer: Self-pay | Admitting: Allergy & Immunology

## 2022-05-12 ENCOUNTER — Ambulatory Visit (INDEPENDENT_AMBULATORY_CARE_PROVIDER_SITE_OTHER): Payer: BC Managed Care – PPO

## 2022-05-12 DIAGNOSIS — J309 Allergic rhinitis, unspecified: Secondary | ICD-10-CM

## 2022-05-18 ENCOUNTER — Encounter (HOSPITAL_COMMUNITY): Payer: Self-pay

## 2022-05-19 ENCOUNTER — Ambulatory Visit (INDEPENDENT_AMBULATORY_CARE_PROVIDER_SITE_OTHER): Payer: BC Managed Care – PPO

## 2022-05-19 DIAGNOSIS — J309 Allergic rhinitis, unspecified: Secondary | ICD-10-CM | POA: Diagnosis not present

## 2022-05-22 ENCOUNTER — Ambulatory Visit (INDEPENDENT_AMBULATORY_CARE_PROVIDER_SITE_OTHER): Payer: Self-pay

## 2022-05-22 NOTE — Telephone Encounter (Signed)
Patient allergy vials to be mixed 05/31/2022

## 2022-05-22 NOTE — Telephone Encounter (Signed)
-----   Message from Robynn Pane, RN sent at 05/19/2022  3:11 PM EDT -----  Regarding: FW: ramadan  ----- Message from Mallie Snooks sent at 05/19/2022  3:07 PM EDT -----  Patient's vials expire July 11 th & needs next set of vials mixed & shipped to [facility] office at [address] [place], [place] [address].  Any questions, Morrie Sheldon can be reached at [phone].  Thank you

## 2022-05-26 ENCOUNTER — Ambulatory Visit (INDEPENDENT_AMBULATORY_CARE_PROVIDER_SITE_OTHER): Payer: BC Managed Care – PPO

## 2022-05-26 ENCOUNTER — Other Ambulatory Visit (INDEPENDENT_AMBULATORY_CARE_PROVIDER_SITE_OTHER): Payer: Self-pay | Admitting: Otolaryngology

## 2022-05-26 DIAGNOSIS — J301 Allergic rhinitis due to pollen: Secondary | ICD-10-CM

## 2022-05-26 DIAGNOSIS — J3089 Other allergic rhinitis: Secondary | ICD-10-CM

## 2022-05-26 DIAGNOSIS — J309 Allergic rhinitis, unspecified: Secondary | ICD-10-CM

## 2022-05-31 ENCOUNTER — Other Ambulatory Visit (INDEPENDENT_AMBULATORY_CARE_PROVIDER_SITE_OTHER): Payer: BC Managed Care – PPO | Admitting: Family

## 2022-05-31 DIAGNOSIS — J301 Allergic rhinitis due to pollen: Secondary | ICD-10-CM

## 2022-05-31 DIAGNOSIS — J3089 Other allergic rhinitis: Secondary | ICD-10-CM

## 2022-06-07 NOTE — Nursing Note (Signed)
05/31/22 1245   ENT Immunotherapy Vial Preparation Flowsheet   Allergy Test Date 02/16/22   Immunotherapy Start Date 03/16/22   Type of Immunotherapy? Subcutaneous   Managing Physician Ramadan   Initials cs   Vial #1   Preparation Date 05/31/22   Expiration Date 08/31/22   Immunotherapy Status No escalation   Diagnosis Allergic Rhinitis due to pollen (J30.1)   Ragweed, Short 1:20 W/V Waynette Buttery)   Ragweed, Short Initial Endpoint 5   Dilution (10% Glycerin) Used 5   Volume of Dilution Used 0.46ml   Pigweed, Rough/Redroot 1:20 W/V Waynette Buttery)   Pigweed, Rough/Redroot Initial Endpoint 5   Dilution (10% Glycerin) Used 5   Volume of Dilution Used 0.62ml   Plantain, English 1:20 W/V Waynette Buttery)   Plantain, English Initial Endpoint 5   Dilution (10% Glycerin) Used 5   Volume of Dilution Used 0.47ml   Lambs Quarter 1:20 W/V (Greer)   Lambs Quarter Initial Endpoint 4   Dilution (10% Glycerin) Used 4   Volume of Dilution Used 0.31ml   Johnson Grass 1:20 W/V Waynette Buttery)   Johnson Grass Initial Endpoint 5   Dilution (10% Glycerin) Used 5   Volume of Dilution Used 0.48ml   Red Oak 1:20 W/V (Greer)   Red Oak Initial Endpoint 5   Dilution (10% Glycerin) Used 5    Volume of Dilution Used 0.3ml   Box Elder 1:20 W/V Waynette Buttery)   Box Elder Initial Endpoint 5   Dilution (10% Glycerin) Used 5    Volume of Dilution Used 0.63ml   Birch, River 1:20 W/V (Greer)   Davis, River Initial Endpoint 5   Dilution (10% Glycerin) Used 5    Volume of Dilution Used 0.46ml   Sycamore, American Eastern 1:20 W/V (Greer)   [place], American [place] Initial Endpoint 5   Dilution (10% Glycerin) Used 5   Volume of Dilution Used 0.35ml   OTHER   Antigen Volume 1.8   Diluent (Normal Saline) Volume 3.2   Total Volume 52ml   Antigen Volume 0.8   Diluent (Normal Saline) Volume 4.2   Total Volume 42ml   Vial #2   Preparation Date 05/31/22   Expiration Date 08/31/22   Immunotherapy Status No escalation   Diagnosis Allergic Rhinitis due to dustmite (J30.89);Allergic Rhinitis due to  mold (J30.89)   Dustmite Farinae  10,000 AU/ML (GREER)   Dustmite Farinae Initial Endpoint 2    Dilution (10% Glycerin) Used 2   Volume of Dilution Used 0.48ml   Dustmite Pteronyssinus 10,000 AU/ML Waynette Buttery)   Dustmite Pteronyssinus Initial Endpoint 4    Dilution (10% Glycerin) Used 4   Volume of Dilution Used 0.2ml   Alternaria 1:20 W/V (Greer)   Alternaria Initial Endpoint 4    Dilution (10% Glycerin) Used 4   Volume of Dilution Used 0.1ml   Bipoloris Sorokiniana 1:20 W/V (Greer)    Bipoloris Sorokiniana Initial Endpoint 3   Dilution (10% Glycerin) Used 3   Volume of Dilution Used 0.54ml     2 (10 unit) vials mixed.

## 2022-06-09 ENCOUNTER — Ambulatory Visit (INDEPENDENT_AMBULATORY_CARE_PROVIDER_SITE_OTHER): Payer: BC Managed Care – PPO

## 2022-06-09 DIAGNOSIS — J309 Allergic rhinitis, unspecified: Secondary | ICD-10-CM

## 2022-06-12 ENCOUNTER — Other Ambulatory Visit: Payer: Self-pay | Admitting: Allergy & Immunology

## 2022-06-16 ENCOUNTER — Ambulatory Visit (INDEPENDENT_AMBULATORY_CARE_PROVIDER_SITE_OTHER): Payer: BC Managed Care – PPO | Admitting: Allergy & Immunology

## 2022-06-16 DIAGNOSIS — J309 Allergic rhinitis, unspecified: Secondary | ICD-10-CM | POA: Diagnosis not present

## 2022-06-23 ENCOUNTER — Ambulatory Visit (INDEPENDENT_AMBULATORY_CARE_PROVIDER_SITE_OTHER): Payer: BC Managed Care – PPO

## 2022-06-23 DIAGNOSIS — J309 Allergic rhinitis, unspecified: Secondary | ICD-10-CM

## 2022-06-30 ENCOUNTER — Ambulatory Visit (INDEPENDENT_AMBULATORY_CARE_PROVIDER_SITE_OTHER): Payer: BC Managed Care – PPO

## 2022-06-30 DIAGNOSIS — J309 Allergic rhinitis, unspecified: Secondary | ICD-10-CM | POA: Diagnosis not present

## 2022-07-07 ENCOUNTER — Ambulatory Visit (INDEPENDENT_AMBULATORY_CARE_PROVIDER_SITE_OTHER): Payer: BC Managed Care – PPO

## 2022-07-07 DIAGNOSIS — J309 Allergic rhinitis, unspecified: Secondary | ICD-10-CM | POA: Diagnosis not present

## 2022-07-07 NOTE — Progress Notes (Signed)
Patient came in today to get his injection. Patient also retrieved his vials as he is starting school Wednesday. I gave patient his vials to take back with him.

## 2022-07-11 ENCOUNTER — Encounter (INDEPENDENT_AMBULATORY_CARE_PROVIDER_SITE_OTHER): Payer: Self-pay | Admitting: Otolaryngology

## 2022-07-14 ENCOUNTER — Encounter (INDEPENDENT_AMBULATORY_CARE_PROVIDER_SITE_OTHER): Payer: BC Managed Care – PPO

## 2022-07-14 DIAGNOSIS — J301 Allergic rhinitis due to pollen: Secondary | ICD-10-CM

## 2022-07-14 DIAGNOSIS — J3089 Other allergic rhinitis: Secondary | ICD-10-CM

## 2022-07-17 ENCOUNTER — Ambulatory Visit (INDEPENDENT_AMBULATORY_CARE_PROVIDER_SITE_OTHER): Payer: Self-pay | Admitting: Otolaryngology

## 2022-07-17 NOTE — Telephone Encounter (Signed)
I attempted to call patient. Pt can bring vials to clinic as he is one of our patients who was taking his allergy vials to a clinic closer to home while he was home for the summer. Can be scheduled in an injection slot. No response. Voicemail full. Unable to leave VM. Will attempt to call again tomorrow.

## 2022-07-17 NOTE — Telephone Encounter (Signed)
-----   Message from Gardiner Fanti sent at 07/17/2022  2:57 PM EDT -----  Patient [place] in his hometown but wants to get them here the next time.  He has his vials.  I wasn't sure how to schedule that.  [phone]

## 2022-07-18 NOTE — Telephone Encounter (Signed)
I attempted to call patient to schedule injection visit with our clinic. No response. Unable to leave VM. VM is full. Will attempt to call again tomorrow.

## 2022-07-19 ENCOUNTER — Other Ambulatory Visit: Payer: Self-pay

## 2022-07-19 ENCOUNTER — Ambulatory Visit (INDEPENDENT_AMBULATORY_CARE_PROVIDER_SITE_OTHER): Payer: BC Managed Care – PPO

## 2022-07-19 DIAGNOSIS — Z91038 Other insect allergy status: Secondary | ICD-10-CM

## 2022-07-19 DIAGNOSIS — Z68.41 Body mass index (BMI) pediatric, 85th percentile to less than 95th percentile for age: Secondary | ICD-10-CM

## 2022-07-19 DIAGNOSIS — J3089 Other allergic rhinitis: Secondary | ICD-10-CM

## 2022-07-19 DIAGNOSIS — J301 Allergic rhinitis due to pollen: Secondary | ICD-10-CM

## 2022-07-19 NOTE — Nursing Note (Signed)
07/19/22 1300 07/19/22 1315   Mixed Vials   Comments pt brought vials back to clinic  --    Allergy Injection Flowsheet   Date of Injection 07/19/22 07/19/22   Any Reaction to Previous Administration? No No   Any Illness/ Fever in Last 24 hours?* No No   Any Change in Medication? No No   Is the patient on a beta blocker? No No   Does the patient have a valid EpiPen at visit? Yes Yes   Does patient have a history of Asthma? 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 No No   VIAL   Comments [id number]  --

## 2022-07-26 ENCOUNTER — Other Ambulatory Visit: Payer: Self-pay

## 2022-07-26 ENCOUNTER — Ambulatory Visit (INDEPENDENT_AMBULATORY_CARE_PROVIDER_SITE_OTHER): Payer: BC Managed Care – PPO

## 2022-07-26 DIAGNOSIS — J301 Allergic rhinitis due to pollen: Secondary | ICD-10-CM

## 2022-07-26 DIAGNOSIS — J3089 Other allergic rhinitis: Secondary | ICD-10-CM

## 2022-07-26 DIAGNOSIS — Z68.41 Body mass index (BMI) pediatric, 85th percentile to less than 95th percentile for age: Secondary | ICD-10-CM

## 2022-07-26 DIAGNOSIS — Z91038 Other insect allergy status: Secondary | ICD-10-CM

## 2022-07-26 NOTE — Progress Notes (Signed)
I discussed the patient's care with the Resident prior to the patient leaving the clinic. Any significant discussion points are noted.    Pincus Sanes, APRN,NP-C 07/26/2022, 13:17

## 2022-07-26 NOTE — Nursing Note (Signed)
07/26/22 1300 07/26/22 1312   Allergy Injection Flowsheet   Date of Injection 07/26/22 07/26/22   Any Reaction to Previous Administration? No No   Any Illness/ Fever in Last 24 hours?* No No   Any Change in Medication? No No   Is the patient on a beta blocker? No No   Does the patient have a valid EpiPen at visit? Yes Yes   Does patient have a history of Asthma? 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 No No   VIAL   Comments  --  10/2023     Jackelyn Knife, LPN

## 2022-08-02 ENCOUNTER — Other Ambulatory Visit (INDEPENDENT_AMBULATORY_CARE_PROVIDER_SITE_OTHER): Payer: Self-pay | Admitting: Otolaryngology

## 2022-08-02 ENCOUNTER — Other Ambulatory Visit: Payer: Self-pay

## 2022-08-02 ENCOUNTER — Ambulatory Visit (INDEPENDENT_AMBULATORY_CARE_PROVIDER_SITE_OTHER): Payer: BC Managed Care – PPO

## 2022-08-02 DIAGNOSIS — Z91038 Other insect allergy status: Secondary | ICD-10-CM

## 2022-08-02 DIAGNOSIS — J301 Allergic rhinitis due to pollen: Secondary | ICD-10-CM

## 2022-08-02 DIAGNOSIS — J3089 Other allergic rhinitis: Secondary | ICD-10-CM

## 2022-08-02 NOTE — Nursing Note (Signed)
08/02/22 1300 08/02/22 1308   Allergy Injection Flowsheet   Date of Injection 08/02/22 08/02/22   Any Reaction to Previous Administration? No No   Any Illness/ Fever in Last 24 hours?* No No   Any Change in Medication? No No   Is the patient on a beta blocker? No No   Does the patient have a valid EpiPen at visit? Yes Yes   Does patient have a history of Asthma? 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 No No   VIAL   Comments  --  [id number]--refil on Singular to [facility]

## 2022-08-02 NOTE — Telephone Encounter (Signed)
Pt requested refill on Singular to be sent to [facility].  Last appointment 02/21/2022, next appointment 10/17/2022

## 2022-08-08 MED ORDER — MONTELUKAST 10 MG TABLET
10.0000 mg | ORAL_TABLET | Freq: Every evening | ORAL | 3 refills | Status: DC
Start: 2022-08-08 — End: 2023-01-05

## 2022-08-09 ENCOUNTER — Ambulatory Visit (INDEPENDENT_AMBULATORY_CARE_PROVIDER_SITE_OTHER): Payer: BC Managed Care – PPO

## 2022-08-09 ENCOUNTER — Other Ambulatory Visit: Payer: Self-pay

## 2022-08-09 DIAGNOSIS — Z91038 Other insect allergy status: Secondary | ICD-10-CM

## 2022-08-09 DIAGNOSIS — J3089 Other allergic rhinitis: Secondary | ICD-10-CM

## 2022-08-09 DIAGNOSIS — J301 Allergic rhinitis due to pollen: Secondary | ICD-10-CM

## 2022-08-09 NOTE — Nursing Note (Signed)
08/09/22 1300 08/09/22 1308   Mixed Vials   Comments  --  remix   Allergy Injection Flowsheet   Date of Injection 08/09/22 08/09/22   Any Reaction to Previous Administration? No No   Any Illness/ Fever in Last 24 hours?* No No   Any Change in Medication? No No   Is the patient on a beta blocker? No No   Does the patient have a valid EpiPen at visit? Yes Yes   Does patient have a history of Asthma? 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 No No   VIAL   Comments [id number]  --

## 2022-08-16 ENCOUNTER — Other Ambulatory Visit: Payer: Self-pay

## 2022-08-16 ENCOUNTER — Ambulatory Visit (INDEPENDENT_AMBULATORY_CARE_PROVIDER_SITE_OTHER): Payer: BC Managed Care – PPO

## 2022-08-16 ENCOUNTER — Encounter (HOSPITAL_COMMUNITY): Payer: Self-pay

## 2022-08-16 DIAGNOSIS — J3089 Other allergic rhinitis: Secondary | ICD-10-CM

## 2022-08-16 DIAGNOSIS — J301 Allergic rhinitis due to pollen: Secondary | ICD-10-CM

## 2022-08-16 DIAGNOSIS — Z91038 Other insect allergy status: Secondary | ICD-10-CM

## 2022-08-16 DIAGNOSIS — Z68.41 Body mass index (BMI) pediatric, 85th percentile to less than 95th percentile for age: Secondary | ICD-10-CM

## 2022-08-16 NOTE — Nursing Note (Signed)
08/16/22 1300 08/16/22 1309   Allergy Injection Flowsheet   Date of Injection 08/16/22 08/16/22   Any Reaction to Previous Administration? No No   Any Illness/ Fever in Last 24 hours?* No No   Any Change in Medication? No No   Is the patient on a beta blocker? No No   Does the patient have a valid EpiPen at visit? Yes Yes   Does patient have a history of Asthma? 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 No No   VIAL   Comments [id number]  --

## 2022-08-21 ENCOUNTER — Other Ambulatory Visit (INDEPENDENT_AMBULATORY_CARE_PROVIDER_SITE_OTHER): Payer: Self-pay | Admitting: Otolaryngology

## 2022-08-21 DIAGNOSIS — J3089 Other allergic rhinitis: Secondary | ICD-10-CM

## 2022-08-21 DIAGNOSIS — J301 Allergic rhinitis due to pollen: Secondary | ICD-10-CM

## 2022-08-22 ENCOUNTER — Other Ambulatory Visit (INDEPENDENT_AMBULATORY_CARE_PROVIDER_SITE_OTHER): Payer: BC Managed Care – PPO | Admitting: Otolaryngology

## 2022-08-22 DIAGNOSIS — J301 Allergic rhinitis due to pollen: Secondary | ICD-10-CM

## 2022-08-22 DIAGNOSIS — Z68.41 Body mass index (BMI) pediatric, 85th percentile to less than 95th percentile for age: Secondary | ICD-10-CM

## 2022-08-22 DIAGNOSIS — J3089 Other allergic rhinitis: Secondary | ICD-10-CM

## 2022-08-23 ENCOUNTER — Ambulatory Visit (INDEPENDENT_AMBULATORY_CARE_PROVIDER_SITE_OTHER): Payer: BC Managed Care – PPO

## 2022-08-23 ENCOUNTER — Other Ambulatory Visit: Payer: Self-pay

## 2022-08-23 DIAGNOSIS — Z68.41 Body mass index (BMI) pediatric, 85th percentile to less than 95th percentile for age: Secondary | ICD-10-CM

## 2022-08-23 DIAGNOSIS — J301 Allergic rhinitis due to pollen: Secondary | ICD-10-CM

## 2022-08-23 DIAGNOSIS — J3089 Other allergic rhinitis: Secondary | ICD-10-CM

## 2022-08-23 DIAGNOSIS — Z91038 Other insect allergy status: Secondary | ICD-10-CM

## 2022-08-23 NOTE — Nursing Note (Signed)
08/23/22 1000   ENT Immunotherapy Vial Preparation Flowsheet   Allergy Test Date 02/16/22   Immunotherapy Start Date 03/16/22   Type of Immunotherapy? Subcutaneous   Managing Physician Rasaan Brotherton   Initials cs   Vial #1   Preparation Date 08/22/22   Expiration Date 11/21/22   Immunotherapy Status No escalation   Diagnosis Allergic Rhinitis due to pollen (J30.1)   Ragweed, Short 1:20 W/V Lavella Hammock)   Ragweed, Short Initial Endpoint 5   Dilution (10% Glycerin) Used 5   Volume of Dilution Used 0.7ml   Pigweed, Rough/Redroot 1:20 W/V Lavella Hammock)   Pigweed, Rough/Redroot Initial Endpoint 5   Dilution (10% Glycerin) Used 5   Volume of Dilution Used 0.10ml   Plantain, English 1:20 W/V Lavella Hammock)   Plantain, English Initial Endpoint 5   Dilution (10% Glycerin) Used 5   Volume of Dilution Used 0.75ml   Lambs Quarter 1:20 W/V (Greer)   Lambs Quarter Initial Endpoint 4   Dilution (10% Glycerin) Used 4   Volume of Dilution Used 0.32ml   Johnson Grass 1:20 W/V Lavella Hammock)   Johnson Grass Initial Endpoint 5   Dilution (10% Glycerin) Used 5   Volume of Dilution Used 0.61ml   Red Oak 1:20 W/V (Greer)   Red Oak Initial Endpoint 5   Dilution (10% Glycerin) Used 5    Volume of Dilution Used 0.92ml   Box Elder 1:20 W/V Lavella Hammock)   Box Elder Initial Endpoint 5   Dilution (10% Glycerin) Used 5    Volume of Dilution Used 0.33ml   Birch, River 1:20 W/V (Greer)   Rex, River Initial Endpoint 5   Dilution (10% Glycerin) Used 5    Volume of Dilution Used 0.5ml   Sycamore, American Eastern 1:20 W/V (Greer)   [place], American [place] Initial Endpoint 5   Dilution (10% Glycerin) Used 5   Volume of Dilution Used 0.8ml   OTHER   Antigen Volume 1.8   Diluent (Normal Saline) Volume 3.2   Total Volume 34ml   Antigen Volume 0.8   Diluent (Normal Saline) Volume 4.2   Total Volume 26ml   Vial #2   Preparation Date 08/22/22   Expiration Date 11/21/22   Immunotherapy Status No escalation   Diagnosis Allergic Rhinitis due to dustmite (J30.89);Allergic Rhinitis due to  mold (J30.89)   Dustmite Farinae  10,000 AU/ML (GREER)   Dustmite Farinae Initial Endpoint 2    Dilution (10% Glycerin) Used 2   Volume of Dilution Used 0.33ml   Dustmite Pteronyssinus 10,000 AU/ML Lavella Hammock)   Dustmite Pteronyssinus Initial Endpoint 4    Dilution (10% Glycerin) Used 4   Volume of Dilution Used 0.63ml   Alternaria 1:20 W/V (Greer)   Alternaria Initial Endpoint 4    Dilution (10% Glycerin) Used 4   Volume of Dilution Used 0.75ml   Bipoloris Sorokiniana 1:20 W/V (Greer)    Bipoloris Sorokiniana Initial Endpoint 3   Dilution (10% Glycerin) Used 3   Volume of Dilution Used 0.68ml     2 (10 unit) vials mixed.

## 2022-08-23 NOTE — Nursing Note (Signed)
08/23/22 1300 08/23/22 1311   Allergy Injection Flowsheet   Date of Injection 08/23/22 08/23/22   Any Reaction to Previous Administration? No No   Any Illness/ Fever in Last 24 hours?* No No   Any Change in Medication? No No   Is the patient on a beta blocker? No No   Does the patient have a valid EpiPen at visit? Yes Yes   Does patient have a history of Asthma? 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 No No   VIAL   Comments [id number]  --

## 2022-08-30 ENCOUNTER — Ambulatory Visit (INDEPENDENT_AMBULATORY_CARE_PROVIDER_SITE_OTHER): Payer: BC Managed Care – PPO

## 2022-08-30 ENCOUNTER — Other Ambulatory Visit: Payer: Self-pay

## 2022-08-30 DIAGNOSIS — Z68.41 Body mass index (BMI) pediatric, 85th percentile to less than 95th percentile for age: Secondary | ICD-10-CM

## 2022-08-30 DIAGNOSIS — Z91038 Other insect allergy status: Secondary | ICD-10-CM

## 2022-08-30 DIAGNOSIS — J3089 Other allergic rhinitis: Secondary | ICD-10-CM

## 2022-08-30 DIAGNOSIS — J301 Allergic rhinitis due to pollen: Secondary | ICD-10-CM

## 2022-08-30 NOTE — Nursing Note (Signed)
08/30/22 1300 08/30/22 1317   Allergy Injection Flowsheet   Date of Injection 08/30/22 08/30/22   Any Reaction to Previous Administration? No No   Any Illness/ Fever in Last 24 hours?* No No   Any Change in Medication? No No   Is the patient on a beta blocker? No No   Does the patient have a valid EpiPen at visit? Yes Yes   Does patient have a history of Asthma? 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 No No   VIAL   Comments  --  [id number]

## 2022-09-06 ENCOUNTER — Ambulatory Visit (INDEPENDENT_AMBULATORY_CARE_PROVIDER_SITE_OTHER): Payer: Self-pay | Admitting: Otolaryngology

## 2022-09-06 ENCOUNTER — Other Ambulatory Visit: Payer: Self-pay

## 2022-09-06 ENCOUNTER — Ambulatory Visit (INDEPENDENT_AMBULATORY_CARE_PROVIDER_SITE_OTHER): Payer: BC Managed Care – PPO

## 2022-09-06 DIAGNOSIS — J3089 Other allergic rhinitis: Secondary | ICD-10-CM

## 2022-09-06 DIAGNOSIS — J301 Allergic rhinitis due to pollen: Secondary | ICD-10-CM

## 2022-09-06 DIAGNOSIS — Z91038 Other insect allergy status: Secondary | ICD-10-CM

## 2022-09-06 NOTE — Nursing Note (Signed)
09/06/22 1300 09/06/22 1315   Allergy Injection Flowsheet   Date of Injection 09/06/22 09/06/22   Any Reaction to Previous Administration? No No   Any Illness/ Fever in Last 24 hours?* No No   Any Change in Medication? No No   Is the patient on a beta blocker? No No   Does the patient have a valid EpiPen at visit? Yes Yes   Does patient have a history of Asthma? 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 No No   VIAL   Comments  --  [id number]

## 2022-09-06 NOTE — Telephone Encounter (Signed)
Patient needs sooner appointment with Dr. Ramadan. Called to ask patient if he can come in tomorrow, 09/07/2022 to see Dr. Ramadan as he is R[facility] openings for allergy f/u. No response. Message and callback number left on voicemail.

## 2022-09-13 ENCOUNTER — Other Ambulatory Visit: Payer: Self-pay

## 2022-09-13 ENCOUNTER — Ambulatory Visit (INDEPENDENT_AMBULATORY_CARE_PROVIDER_SITE_OTHER): Payer: BC Managed Care – PPO

## 2022-09-13 DIAGNOSIS — Z68.41 Body mass index (BMI) pediatric, 85th percentile to less than 95th percentile for age: Secondary | ICD-10-CM

## 2022-09-13 DIAGNOSIS — Z91038 Other insect allergy status: Secondary | ICD-10-CM

## 2022-09-13 DIAGNOSIS — J301 Allergic rhinitis due to pollen: Secondary | ICD-10-CM

## 2022-09-13 DIAGNOSIS — J3089 Other allergic rhinitis: Secondary | ICD-10-CM

## 2022-09-13 NOTE — Nursing Note (Signed)
09/13/22 1300 09/13/22 1317   Allergy Injection Flowsheet   Date of Injection 09/13/22 09/13/22   Any Reaction to Previous Administration? No No   Any Illness/ Fever in Last 24 hours?* No No   Any Change in Medication? No No   Is the patient on a beta blocker? No No   Does the patient have a valid EpiPen at visit? Yes Yes   Does patient have a history of Asthma? 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 No No   VIAL   Comments [id number]  --

## 2022-09-20 ENCOUNTER — Ambulatory Visit (INDEPENDENT_AMBULATORY_CARE_PROVIDER_SITE_OTHER): Payer: BC Managed Care – PPO

## 2022-09-20 ENCOUNTER — Other Ambulatory Visit: Payer: Self-pay

## 2022-09-20 DIAGNOSIS — J3089 Other allergic rhinitis: Secondary | ICD-10-CM

## 2022-09-20 DIAGNOSIS — Z91038 Other insect allergy status: Secondary | ICD-10-CM

## 2022-09-20 DIAGNOSIS — J301 Allergic rhinitis due to pollen: Secondary | ICD-10-CM

## 2022-09-20 NOTE — Nursing Note (Signed)
09/20/22 1300 09/20/22 1312   Allergy Injection Flowsheet   Date of Injection 09/20/22 09/20/22   Any Reaction to Previous Administration? No No   Any Illness/ Fever in Last 24 hours?* No No   Any Change in Medication? No No   Is the patient on a beta blocker? No No   Does the patient have a valid EpiPen at visit? Yes Yes   Does patient have a history of Asthma? 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 No No   VIAL   Comments [id number]  --

## 2022-09-24 IMAGING — DX DG ANKLE COMPLETE 3+V*R*
3 series · 3 of 3 positions shown · non-contrast
Comparison: None.

CLINICAL DATA: Football injury, heard pop.

EXAM:
RIGHT ANKLE - COMPLETE 3+ VIEW

[ankle ap]
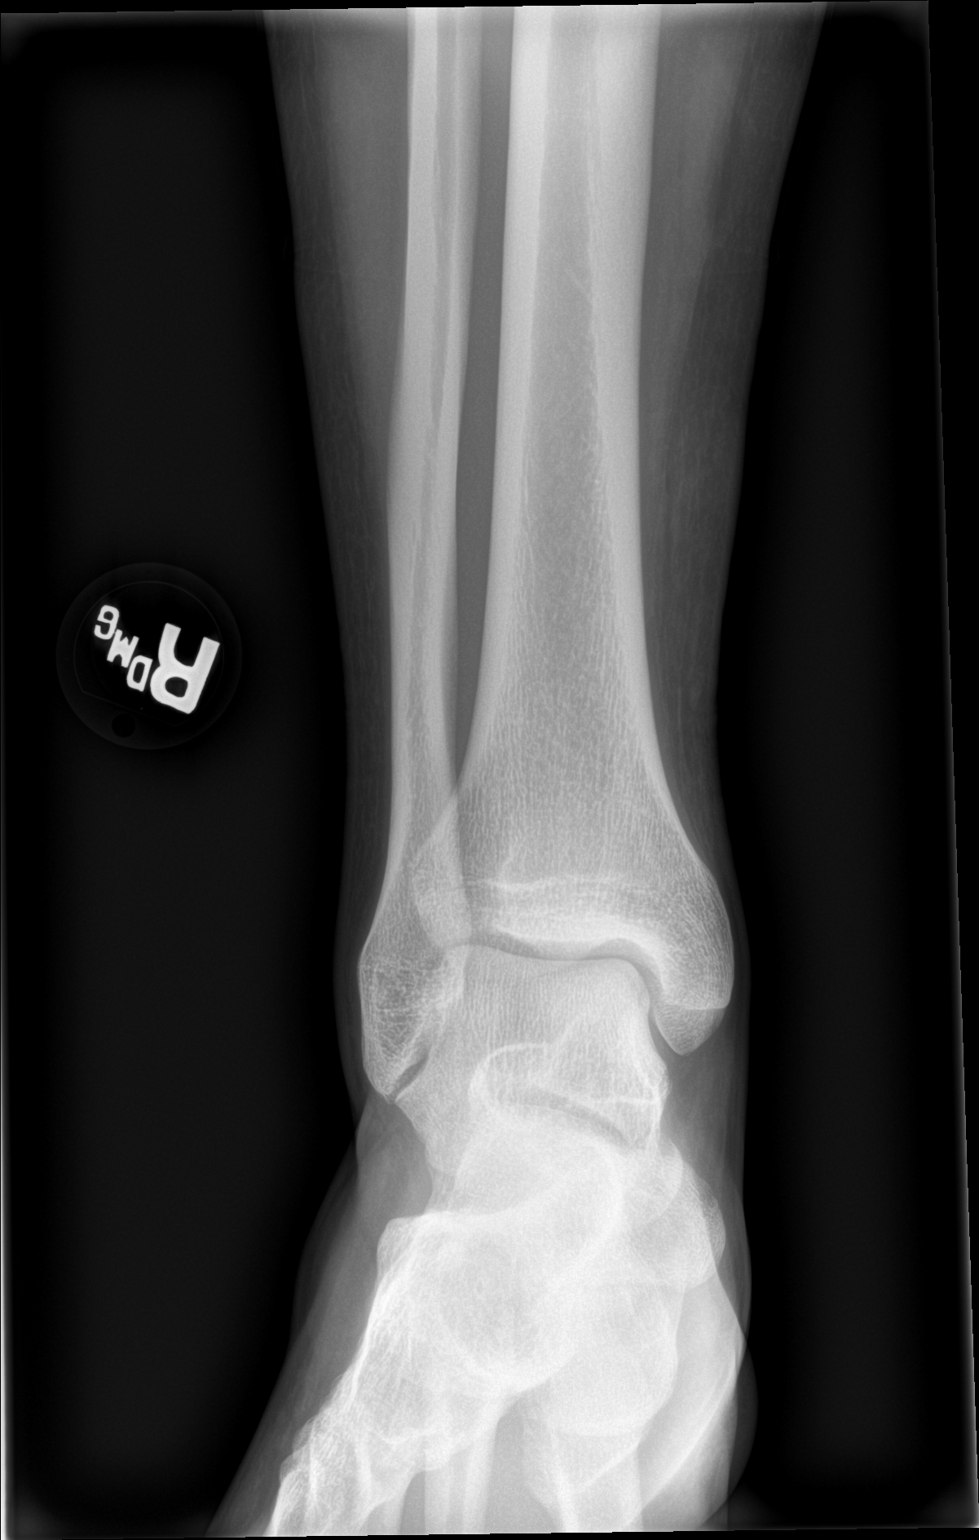

[ankle obl]
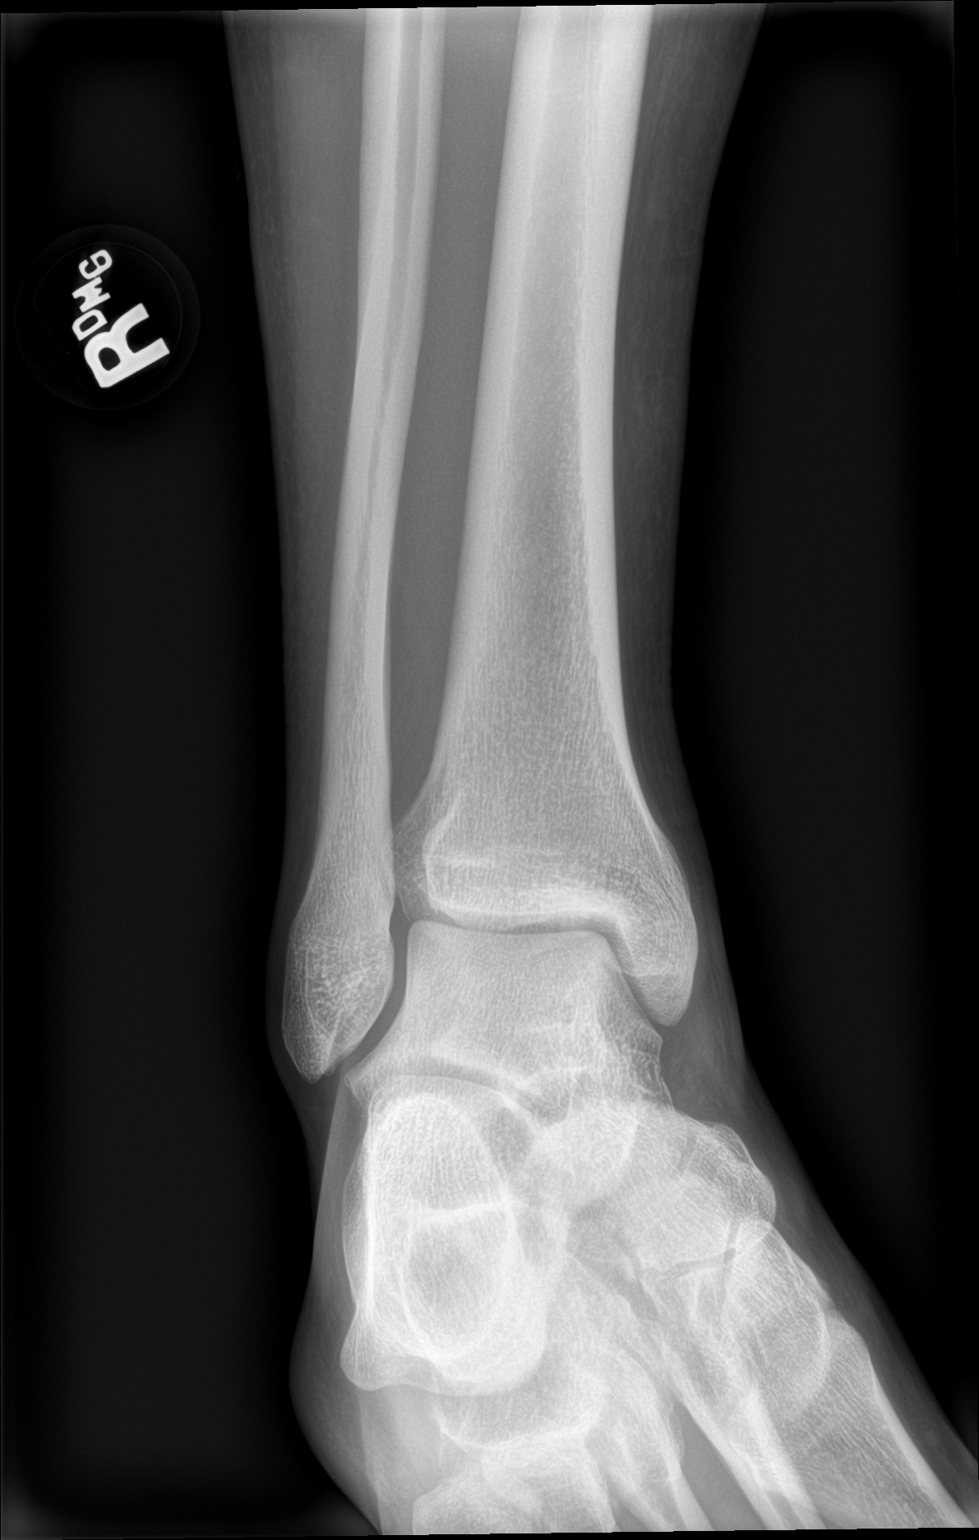

[ankle lat]
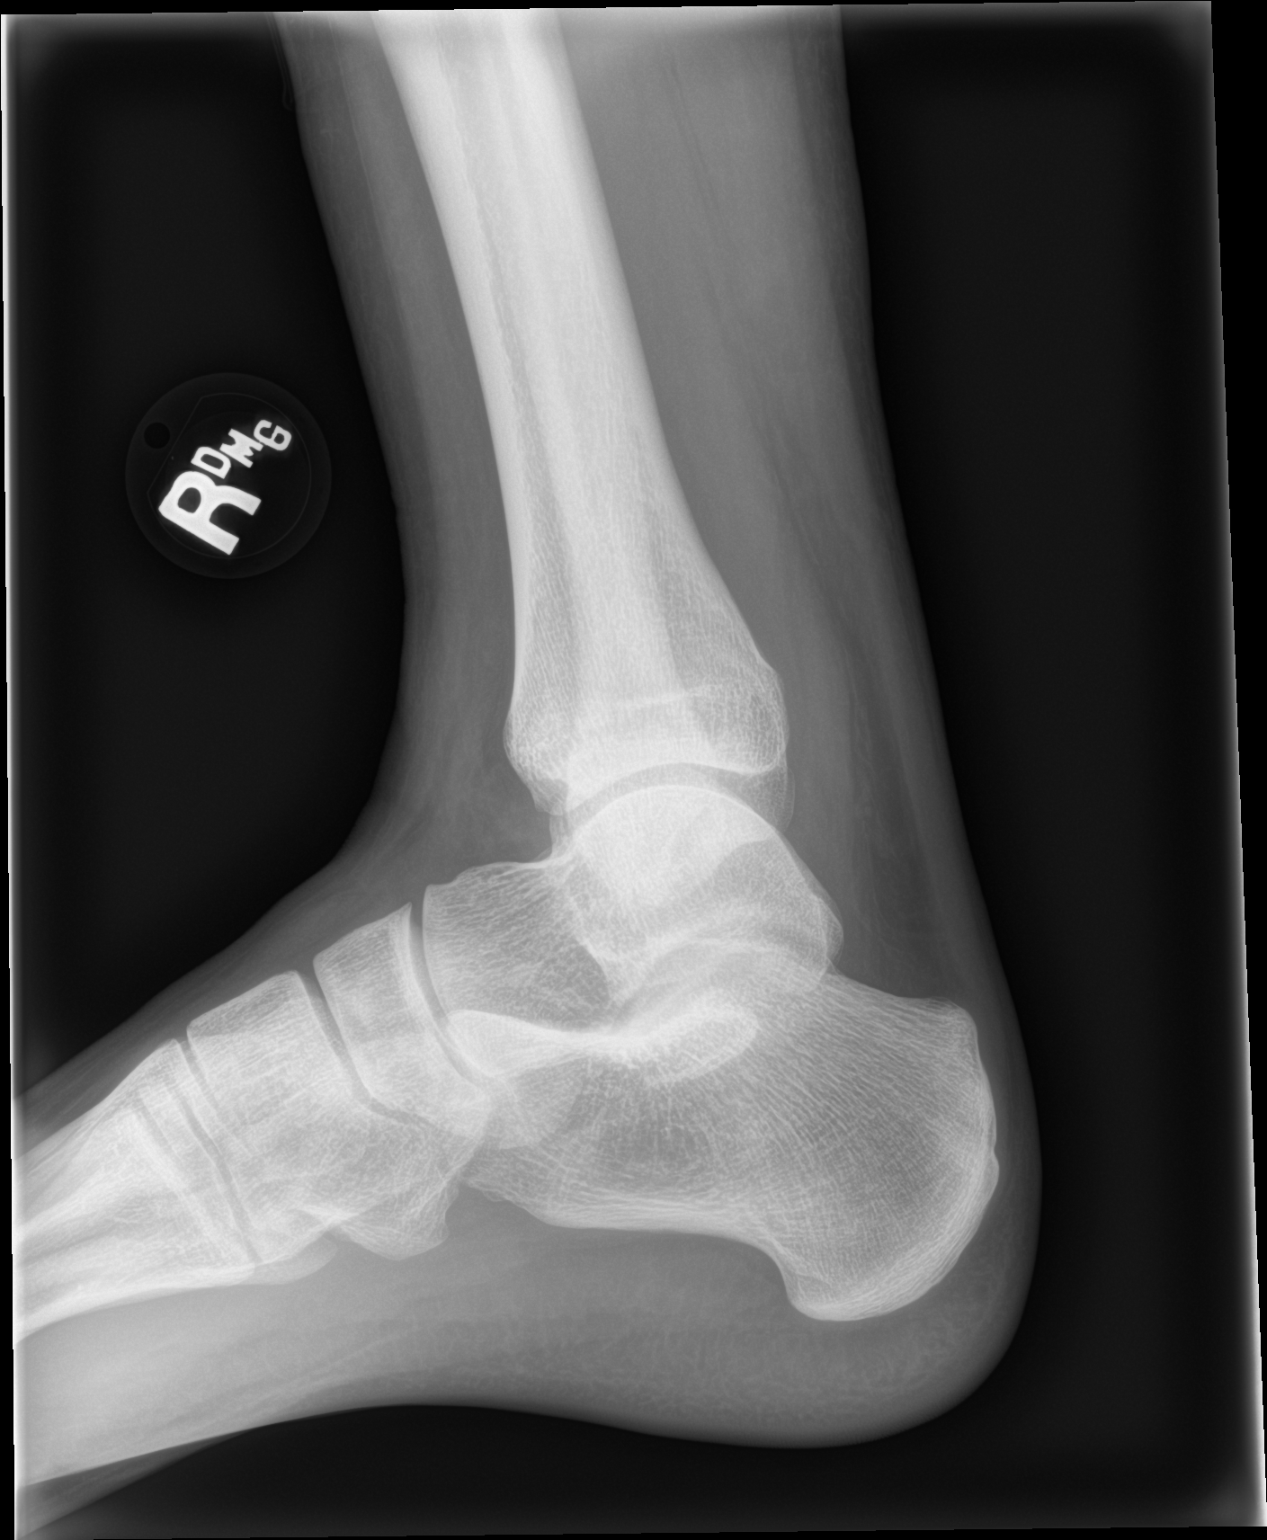

[3 of 3 positions shown; findings below may reference images not displayed]

FINDINGS: There is no evidence of fracture, dislocation, or joint effusion.
There is no evidence of arthropathy or other focal bone abnormality.
Soft tissues are unremarkable.
IMPRESSION: Negative.

## 2022-09-27 ENCOUNTER — Ambulatory Visit (INDEPENDENT_AMBULATORY_CARE_PROVIDER_SITE_OTHER): Payer: BC Managed Care – PPO

## 2022-09-27 ENCOUNTER — Other Ambulatory Visit: Payer: Self-pay

## 2022-09-27 DIAGNOSIS — Z91038 Other insect allergy status: Secondary | ICD-10-CM

## 2022-09-27 DIAGNOSIS — J301 Allergic rhinitis due to pollen: Secondary | ICD-10-CM

## 2022-09-27 DIAGNOSIS — J3089 Other allergic rhinitis: Secondary | ICD-10-CM

## 2022-09-27 NOTE — Nursing Note (Signed)
09/27/22 1300 09/27/22 1316   Allergy Injection Flowsheet   Date of Injection 09/27/22 09/27/22   Any Reaction to Previous Administration? No No   Any Illness/ Fever in Last 24 hours?* No No   Any Change in Medication? No No   Is the patient on a beta blocker? No No   Does the patient have a valid EpiPen at visit? Yes Yes   Does patient have a history of Asthma? 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 No No   VIAL   Comments 10/2023  --

## 2022-10-04 ENCOUNTER — Ambulatory Visit (INDEPENDENT_AMBULATORY_CARE_PROVIDER_SITE_OTHER): Payer: BC Managed Care – PPO

## 2022-10-04 ENCOUNTER — Other Ambulatory Visit: Payer: Self-pay

## 2022-10-04 DIAGNOSIS — J3089 Other allergic rhinitis: Secondary | ICD-10-CM

## 2022-10-04 DIAGNOSIS — Z91038 Other insect allergy status: Secondary | ICD-10-CM

## 2022-10-04 DIAGNOSIS — J301 Allergic rhinitis due to pollen: Secondary | ICD-10-CM

## 2022-10-04 DIAGNOSIS — Z68.41 Body mass index (BMI) pediatric, 85th percentile to less than 95th percentile for age: Secondary | ICD-10-CM

## 2022-10-04 NOTE — Nursing Note (Signed)
10/04/22 1300 10/04/22 1314   Allergy Injection Flowsheet   Date of Injection 10/04/22 10/04/22   Any Reaction to Previous Administration? No No   Any Illness/ Fever in Last 24 hours?* No No   Any Change in Medication? No No   Is the patient on a beta blocker? No No   Does the patient have a valid EpiPen at visit? Yes Yes   Does patient have a history of Asthma? 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 No No   VIAL   Comments  --  10/2023     Jackelyn Knife, LPN

## 2022-10-11 ENCOUNTER — Other Ambulatory Visit: Payer: Self-pay

## 2022-10-11 ENCOUNTER — Ambulatory Visit (INDEPENDENT_AMBULATORY_CARE_PROVIDER_SITE_OTHER): Payer: BC Managed Care – PPO

## 2022-10-11 DIAGNOSIS — Z68.41 Body mass index (BMI) pediatric, 85th percentile to less than 95th percentile for age: Secondary | ICD-10-CM

## 2022-10-11 DIAGNOSIS — J301 Allergic rhinitis due to pollen: Secondary | ICD-10-CM

## 2022-10-11 DIAGNOSIS — J3089 Other allergic rhinitis: Secondary | ICD-10-CM

## 2022-10-11 DIAGNOSIS — Z91038 Other insect allergy status: Secondary | ICD-10-CM

## 2022-10-11 NOTE — Nursing Note (Signed)
10/11/22 1300 10/11/22 1314   Allergy Injection Flowsheet   Date of Injection 10/11/22 10/11/22   Any Reaction to Previous Administration? No No   Any Illness/ Fever in Last 24 hours?* No No   Any Change in Medication? No No   Is the patient on a beta blocker? No No   Does the patient have a valid EpiPen at visit? Yes Yes   Does patient have a history of Asthma? 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 No No   VIAL   Comments [id number]  --

## 2022-10-17 ENCOUNTER — Encounter (INDEPENDENT_AMBULATORY_CARE_PROVIDER_SITE_OTHER): Payer: BC Managed Care – PPO | Admitting: Otolaryngology

## 2022-10-25 ENCOUNTER — Other Ambulatory Visit: Payer: Self-pay

## 2022-10-25 ENCOUNTER — Ambulatory Visit (INDEPENDENT_AMBULATORY_CARE_PROVIDER_SITE_OTHER): Payer: BC Managed Care – PPO

## 2022-10-25 DIAGNOSIS — J301 Allergic rhinitis due to pollen: Secondary | ICD-10-CM

## 2022-10-25 DIAGNOSIS — Z68.41 Body mass index (BMI) pediatric, 85th percentile to less than 95th percentile for age: Secondary | ICD-10-CM

## 2022-10-25 DIAGNOSIS — Z91038 Other insect allergy status: Secondary | ICD-10-CM

## 2022-10-25 DIAGNOSIS — J3089 Other allergic rhinitis: Secondary | ICD-10-CM

## 2022-10-25 NOTE — Nursing Note (Signed)
10/25/22 1300 10/25/22 1311   Mixed Vials   Comments  --  remix   Allergy Injection Flowsheet   Date of Injection 10/25/22 10/25/22   Any Reaction to Previous Administration? No No   Any Illness/ Fever in Last 24 hours?* No No   Any Change in Medication? No No   Is the patient on a beta blocker? No No   Does the patient have a valid EpiPen at visit? Yes  --    Does patient have a history of Asthma? 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 No No   VIAL   Comments [id number]  --

## 2022-11-01 ENCOUNTER — Ambulatory Visit (INDEPENDENT_AMBULATORY_CARE_PROVIDER_SITE_OTHER): Payer: BC Managed Care – PPO

## 2022-11-01 ENCOUNTER — Other Ambulatory Visit: Payer: Self-pay

## 2022-11-01 DIAGNOSIS — J301 Allergic rhinitis due to pollen: Secondary | ICD-10-CM

## 2022-11-01 DIAGNOSIS — Z91038 Other insect allergy status: Secondary | ICD-10-CM

## 2022-11-01 DIAGNOSIS — Z68.41 Body mass index (BMI) pediatric, 85th percentile to less than 95th percentile for age: Secondary | ICD-10-CM

## 2022-11-01 DIAGNOSIS — J3089 Other allergic rhinitis: Secondary | ICD-10-CM

## 2022-11-01 NOTE — Nursing Note (Signed)
11/01/22 1300 11/01/22 1317   Allergy Injection Flowsheet   Date of Injection 11/01/22 11/01/22   Any Reaction to Previous Administration? No No   Any Illness/ Fever in Last 24 hours?* No No   Any Change in Medication? No No   Is the patient on a beta blocker? No No   Does the patient have a valid EpiPen at visit? Yes Yes   Does patient have a history of Asthma? 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 No No   VIAL   Comments  --  10/2023     Jackelyn Knife, LPN

## 2022-11-03 ENCOUNTER — Other Ambulatory Visit (INDEPENDENT_AMBULATORY_CARE_PROVIDER_SITE_OTHER): Payer: Self-pay | Admitting: Otolaryngology

## 2022-11-03 DIAGNOSIS — J3089 Other allergic rhinitis: Secondary | ICD-10-CM

## 2022-11-03 DIAGNOSIS — J301 Allergic rhinitis due to pollen: Secondary | ICD-10-CM

## 2022-11-07 ENCOUNTER — Other Ambulatory Visit (INDEPENDENT_AMBULATORY_CARE_PROVIDER_SITE_OTHER): Payer: BC Managed Care – PPO | Admitting: Otolaryngology

## 2022-11-07 DIAGNOSIS — J301 Allergic rhinitis due to pollen: Secondary | ICD-10-CM

## 2022-11-07 DIAGNOSIS — J3089 Other allergic rhinitis: Secondary | ICD-10-CM

## 2022-11-07 DIAGNOSIS — Z68.41 Body mass index (BMI) pediatric, 85th percentile to less than 95th percentile for age: Secondary | ICD-10-CM

## 2022-11-08 ENCOUNTER — Telehealth: Payer: Self-pay

## 2022-11-08 ENCOUNTER — Ambulatory Visit (INDEPENDENT_AMBULATORY_CARE_PROVIDER_SITE_OTHER): Payer: BC Managed Care – PPO

## 2022-11-08 ENCOUNTER — Other Ambulatory Visit: Payer: Self-pay

## 2022-11-08 DIAGNOSIS — J3089 Other allergic rhinitis: Secondary | ICD-10-CM

## 2022-11-08 DIAGNOSIS — Z91038 Other insect allergy status: Secondary | ICD-10-CM

## 2022-11-08 DIAGNOSIS — J301 Allergic rhinitis due to pollen: Secondary | ICD-10-CM

## 2022-11-08 DIAGNOSIS — Z68.41 Body mass index (BMI) pediatric, 85th percentile to less than 95th percentile for age: Secondary | ICD-10-CM

## 2022-11-08 NOTE — Telephone Encounter (Signed)
Patient is coming home for the holidays and will bring his allergy vials to the [place] office to continue immunotherapy while he is here.

## 2022-11-08 NOTE — Nursing Note (Signed)
11/08/22 1315 11/08/22 1320   Mixed Vials   Comments  --  Pt took vials with instructions to [facility] [place] at [place] while on break. [phone]   Allergy Injection Flowsheet   Date of Injection 11/08/22 11/08/22   Any Reaction to Previous Administration? No No   Any Illness/ Fever in Last 24 hours?* No No   Any Change in Medication? No No   Is the patient on a beta blocker? No No   Does the patient have a valid EpiPen at visit? Yes Yes   Does patient have a history of Asthma? 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 No No   VIAL   Comments 10/2023  --        Norton Bivins Feather, LPN

## 2022-11-08 NOTE — Nursing Note (Signed)
11/07/22 1144   ENT Immunotherapy Vial Preparation Flowsheet   Allergy Test Date 02/16/22   Immunotherapy Start Date 03/16/22   Type of Immunotherapy? Subcutaneous   Managing Physician Uzoma Vivona   Initials cs   Vial #1   Preparation Date 11/07/22   Expiration Date 02/06/23   Immunotherapy Status No escalation   Diagnosis Allergic Rhinitis due to pollen (J30.1)   Ragweed, Short 1:20 W/V Waynette Buttery)   Ragweed, Short Initial Endpoint 5   Dilution (10% Glycerin) Used 5   Volume of Dilution Used 0.6ml   Pigweed, Rough/Redroot 1:20 W/V Waynette Buttery)   Pigweed, Rough/Redroot Initial Endpoint 5   Dilution (10% Glycerin) Used 5   Volume of Dilution Used 0.72ml   Plantain, English 1:20 W/V Waynette Buttery)   Plantain, English Initial Endpoint 5   Dilution (10% Glycerin) Used 5   Volume of Dilution Used 0.30ml   [place] 1:20 W/V (Greer)   [place] Initial Endpoint 4   Dilution (10% Glycerin) Used 4   Volume of Dilution Used 0.69ml   [place] 1:20 W/V Waynette Buttery)   [place] Grass Initial Endpoint 5   Dilution (10% Glycerin) Used 5   Volume of Dilution Used 0.52ml   Red Oak 1:20 W/V (Greer)   Red Oak Initial Endpoint 5   Dilution (10% Glycerin) Used 5    Volume of Dilution Used 0.68ml   [place] 1:20 W/V Waynette Buttery)   [place] Initial Endpoint 5   Dilution (10% Glycerin) Used 5    Volume of Dilution Used 0.1ml   Birch, [place] 1:20 W/V (Greer)   [place], [place] Initial Endpoint 5   Dilution (10% Glycerin) Used 5    Volume of Dilution Used 0.49ml   Sycamore, American Eastern 1:20 W/V (Greer)   [place], American [place] Initial Endpoint 5   Dilution (10% Glycerin) Used 5   Volume of Dilution Used 0.38ml   OTHER   Antigen Volume 1.8   Diluent (Normal Saline) Volume 3.2   Total Volume 14ml   Antigen Volume 0.8   Diluent (Normal Saline) Volume 4.2   Total Volume 32ml   Vial #2   Preparation Date 11/07/22   Expiration Date 02/06/23   Immunotherapy Status No escalation   Diagnosis Allergic Rhinitis due to dustmite (J30.89);Allergic Rhinitis due to  mold (J30.89)   Dustmite Farinae  10,000 AU/ML (GREER)   Dustmite Farinae Initial Endpoint 2    Dilution (10% Glycerin) Used 2   Volume of Dilution Used 0.7ml   Dustmite Pteronyssinus 10,000 AU/ML Waynette Buttery)   Dustmite Pteronyssinus Initial Endpoint 4    Dilution (10% Glycerin) Used 4   Volume of Dilution Used 0.16ml   Alternaria 1:20 W/V (Greer)   Alternaria Initial Endpoint 4    Dilution (10% Glycerin) Used 4   Volume of Dilution Used 0.43ml   Bipoloris Sorokiniana 1:20 W/V (Greer)    Bipoloris Sorokiniana Initial Endpoint 3   Dilution (10% Glycerin) Used 3   Volume of Dilution Used 0.54ml     2 (10 unit) vials mixed.

## 2022-11-15 ENCOUNTER — Ambulatory Visit (INDEPENDENT_AMBULATORY_CARE_PROVIDER_SITE_OTHER): Payer: BC Managed Care – PPO

## 2022-11-15 DIAGNOSIS — J309 Allergic rhinitis, unspecified: Secondary | ICD-10-CM

## 2022-11-29 ENCOUNTER — Ambulatory Visit (INDEPENDENT_AMBULATORY_CARE_PROVIDER_SITE_OTHER): Payer: BC Managed Care – PPO

## 2022-11-29 ENCOUNTER — Telehealth: Payer: Self-pay

## 2022-11-29 DIAGNOSIS — J309 Allergic rhinitis, unspecified: Secondary | ICD-10-CM | POA: Diagnosis not present

## 2022-11-29 NOTE — Telephone Encounter (Signed)
Patient came in today for his allergy injections and expressed that he was leaving to go back to school this week and needed his vials. I gave patient his injections and packed up his vials as well as paperwork and sent vials and paperwork back with him to take to his school to continue immunotherapy.

## 2022-12-06 ENCOUNTER — Other Ambulatory Visit: Payer: Self-pay

## 2022-12-06 ENCOUNTER — Encounter (INDEPENDENT_AMBULATORY_CARE_PROVIDER_SITE_OTHER): Payer: Self-pay

## 2022-12-06 ENCOUNTER — Ambulatory Visit (INDEPENDENT_AMBULATORY_CARE_PROVIDER_SITE_OTHER): Payer: BC Managed Care – PPO

## 2022-12-06 DIAGNOSIS — J3089 Other allergic rhinitis: Secondary | ICD-10-CM

## 2022-12-06 DIAGNOSIS — Z68.41 Body mass index (BMI) pediatric, 85th percentile to less than 95th percentile for age: Secondary | ICD-10-CM

## 2022-12-06 DIAGNOSIS — Z91038 Other insect allergy status: Secondary | ICD-10-CM

## 2022-12-06 DIAGNOSIS — J301 Allergic rhinitis due to pollen: Secondary | ICD-10-CM

## 2022-12-06 NOTE — Nursing Note (Signed)
12/06/22 1000 12/06/22 1039   Mixed Vials   Comments Per Dr Mel Almond, give 0.49ml in both vials today. Then continue with 5 wk build up.  --    Allergy Injection Flowsheet   Date of Injection 12/06/22 12/06/22   Allergy Vial Identification Number [id number]   Any Reaction to Previous Administration? No No   Any Illness/ Fever in Last 24 hours?* No No   Any Change in Medication? No No   Is the patient on a beta blocker? No No   Does the patient have a valid EpiPen at visit? Yes Yes   Does patient have a history of Asthma? 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 No No   VIAL   Comments  --  10/2023

## 2022-12-15 ENCOUNTER — Other Ambulatory Visit: Payer: Self-pay

## 2022-12-15 ENCOUNTER — Ambulatory Visit (INDEPENDENT_AMBULATORY_CARE_PROVIDER_SITE_OTHER): Payer: BC Managed Care – PPO

## 2022-12-15 DIAGNOSIS — J301 Allergic rhinitis due to pollen: Secondary | ICD-10-CM

## 2022-12-15 DIAGNOSIS — Z91038 Other insect allergy status: Secondary | ICD-10-CM

## 2022-12-15 DIAGNOSIS — Z68.41 Body mass index (BMI) pediatric, 85th percentile to less than 95th percentile for age: Secondary | ICD-10-CM

## 2022-12-15 DIAGNOSIS — J3089 Other allergic rhinitis: Secondary | ICD-10-CM

## 2022-12-15 NOTE — Nursing Note (Signed)
12/15/22 1500 12/15/22 1528   Allergy Injection Flowsheet   Date of Injection 12/15/22 12/15/22   Allergy Vial Identification Number [id number]   Any Reaction to Previous Administration? No No   Any Illness/ Fever in Last 24 hours?* No No   Any Change in Medication? No No   Is the patient on a beta blocker? No No   Does the patient have a valid EpiPen at visit? Yes Yes   Does patient have a history of Asthma? 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 No No   VIAL   Comments [id number]  --

## 2022-12-22 ENCOUNTER — Ambulatory Visit (INDEPENDENT_AMBULATORY_CARE_PROVIDER_SITE_OTHER): Payer: BC Managed Care – PPO

## 2022-12-22 ENCOUNTER — Other Ambulatory Visit: Payer: Self-pay

## 2022-12-22 DIAGNOSIS — J301 Allergic rhinitis due to pollen: Secondary | ICD-10-CM

## 2022-12-22 DIAGNOSIS — J3089 Other allergic rhinitis: Secondary | ICD-10-CM

## 2022-12-22 DIAGNOSIS — Z91038 Other insect allergy status: Secondary | ICD-10-CM

## 2022-12-22 NOTE — Nursing Note (Signed)
12/22/22 1500 12/22/22 1520   Allergy Injection Flowsheet   Date of Injection 12/22/22 12/22/22   Allergy Vial Identification Number [id number]   Any Reaction to Previous Administration? No No   Any Illness/ Fever in Last 24 hours?* No No   Any Change in Medication? No No   Is the patient on a beta blocker? No No   Does the patient have a valid EpiPen at visit? Yes Yes   Does patient have a history of Asthma? 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 No No   VIAL   Comments 12/24  --

## 2022-12-26 ENCOUNTER — Encounter (INDEPENDENT_AMBULATORY_CARE_PROVIDER_SITE_OTHER): Payer: Self-pay | Admitting: Otolaryngology

## 2022-12-29 ENCOUNTER — Ambulatory Visit (INDEPENDENT_AMBULATORY_CARE_PROVIDER_SITE_OTHER): Payer: BC Managed Care – PPO

## 2022-12-29 ENCOUNTER — Other Ambulatory Visit: Payer: Self-pay

## 2022-12-29 DIAGNOSIS — Z91038 Other insect allergy status: Secondary | ICD-10-CM

## 2022-12-29 DIAGNOSIS — J3089 Other allergic rhinitis: Secondary | ICD-10-CM

## 2022-12-29 DIAGNOSIS — J301 Allergic rhinitis due to pollen: Secondary | ICD-10-CM

## 2023-01-01 NOTE — Nursing Note (Signed)
12/29/22 1500 12/29/22 1542   Allergy Injection Flowsheet   Date of Injection 12/29/22 12/29/22   Allergy Vial Identification Number [id number]   Any Reaction to Previous Administration? No No   Any Illness/ Fever in Last 24 hours?* No No   Any Change in Medication? No No   Is the patient on a beta blocker? No No   Does the patient have a valid EpiPen at visit? Yes Yes   Does patient have a history of Asthma? 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 No No   VIAL   Comments  --  12/24

## 2023-01-02 ENCOUNTER — Other Ambulatory Visit: Payer: Self-pay

## 2023-01-02 ENCOUNTER — Encounter (INDEPENDENT_AMBULATORY_CARE_PROVIDER_SITE_OTHER): Payer: Self-pay | Admitting: Otolaryngology

## 2023-01-02 ENCOUNTER — Ambulatory Visit (INDEPENDENT_AMBULATORY_CARE_PROVIDER_SITE_OTHER): Payer: BC Managed Care – PPO | Admitting: Otolaryngology

## 2023-01-02 ENCOUNTER — Ambulatory Visit (INDEPENDENT_AMBULATORY_CARE_PROVIDER_SITE_OTHER): Payer: Self-pay | Admitting: Otolaryngology

## 2023-01-02 VITALS — BP 130/78 | HR 79 | Temp 97.1°F | Ht 72.0 in | Wt 186.1 lb

## 2023-01-02 DIAGNOSIS — J343 Hypertrophy of nasal turbinates: Secondary | ICD-10-CM

## 2023-01-02 DIAGNOSIS — J309 Allergic rhinitis, unspecified: Secondary | ICD-10-CM

## 2023-01-02 NOTE — Progress Notes (Signed)
ENT, [facility]  [address]  [place] [address]  [phone]    PATIENT NAME:  Tyler Navarro  MRN:  [id number]  DOB:  01-26-2001  DATE OF SERVICE: 01/02/2023    Chief Complaint:  Follow Up and Allergies      HPI:  Tyler Navarro is a 22 y.o. male with allergic rhinitis that we started him on shots about a year ago. He has not been followed up since then. He came to [place] with history of significant reaction with rash for which he had Epipen and had to go to the ED, when he as having his allergy shots elsewhere. At that time, I decided to proceed very cautiously with him. We started him a year ago at his end point instead of 2 concentrations up. He says he has had zero issues at all with the shots over the last year. He can tell that he is much better and improved. No sinus infections reported either.       Medications:  Outpatient Medications Marked as Taking for the 01/02/23 encounter (Office Visit) with Sosha Shepherd, Barbette Merino, MD   Medication Sig   . cetirizine (ZYRTEC) 10 mg Oral Tablet Take 1 Tablet (10 mg total) by mouth Once a day   . fexofenadine (ALLEGRA) 180 mg Oral Tablet Take 1 Tablet (180 mg total) by mouth Once a day   . [DISCONTINUED] montelukast (SINGULAIR) 10 mg Oral Tablet Take 1 Tablet (10 mg total) by mouth Every evening       Allergies:  Allergies   Allergen Reactions   . Grass Pollen    . House Dust    . Tree And Shrub Pollen          Physical Exam:  Blood pressure 130/78, pulse 79, temperature 36.2 C (97.1 F), height 1.829 m (6'), weight 84.4 kg (186 lb 1.1 oz), SpO2 98%.  Body mass index is 25.24 kg/m.  General Appearance: Pleasant, cooperative, healthy, and in no acute distress.  Eyes: Conjunctivae/corneas clear.  Head and Face: Face symmetric, no obvious lesions.   External auditory canals:  Patent without inflammation.  Tympanic membranes:  Intact, translucent, midposition, middle ear aerated.  Nose: Slight turbinate hypertrophy; otherwise, no congestion noted.     Oral Cavity/Oropharynx: No mucosal lesions, masses, or pharyngeal asymmetry.    Procedure:        Data Reviewed:    Allergy testing reviewed.     Assessment:      ICD-10-CM    1. Allergic rhinitis  J30.9       2. 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 I will see him for follow-up in 3 months. SNOT equals 16.     Barbette Merino Kalena Mander, MD

## 2023-01-02 NOTE — Telephone Encounter (Signed)
Wess Botts, MA  Tyrone Apple, RN  Ok per Dr Ramadan to increase left arm only          Note placed in flowsheet.

## 2023-01-05 ENCOUNTER — Other Ambulatory Visit: Payer: Self-pay

## 2023-01-05 ENCOUNTER — Ambulatory Visit (INDEPENDENT_AMBULATORY_CARE_PROVIDER_SITE_OTHER): Payer: BC Managed Care – PPO

## 2023-01-05 ENCOUNTER — Other Ambulatory Visit (INDEPENDENT_AMBULATORY_CARE_PROVIDER_SITE_OTHER): Payer: Self-pay | Admitting: Otolaryngology

## 2023-01-05 DIAGNOSIS — J301 Allergic rhinitis due to pollen: Secondary | ICD-10-CM

## 2023-01-05 DIAGNOSIS — J3089 Other allergic rhinitis: Secondary | ICD-10-CM

## 2023-01-05 DIAGNOSIS — Z91038 Other insect allergy status: Secondary | ICD-10-CM

## 2023-01-05 NOTE — Nursing Note (Signed)
01/05/23 1500 01/05/23 1525   Allergy Injection Flowsheet   Date of Injection 01/05/23 01/05/23   Allergy Vial Identification Number [id number]   Any Reaction to Previous Administration? No No   Any Illness/ Fever in Last 24 hours?* No No   Any Change in Medication? No No   Is the patient on a beta blocker? No No   Does the patient have a valid EpiPen at visit? Yes Yes   Does patient have a history of Asthma? 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 No No   VIAL   Comments 12/24  --

## 2023-01-05 NOTE — Telephone Encounter (Unsigned)
Pt requesting refill on montelukast. Refill routed to [facility] for approval.

## 2023-01-08 MED ORDER — MONTELUKAST 10 MG TABLET
10.0000 mg | ORAL_TABLET | Freq: Every evening | ORAL | 3 refills | Status: DC
Start: 2023-01-08 — End: 2023-03-27

## 2023-01-12 ENCOUNTER — Ambulatory Visit (INDEPENDENT_AMBULATORY_CARE_PROVIDER_SITE_OTHER): Payer: BC Managed Care – PPO

## 2023-01-12 ENCOUNTER — Other Ambulatory Visit: Payer: Self-pay

## 2023-01-12 DIAGNOSIS — J301 Allergic rhinitis due to pollen: Secondary | ICD-10-CM

## 2023-01-12 DIAGNOSIS — J3089 Other allergic rhinitis: Secondary | ICD-10-CM

## 2023-01-12 DIAGNOSIS — Z91038 Other insect allergy status: Secondary | ICD-10-CM

## 2023-01-12 NOTE — Nursing Note (Signed)
01/12/23 1500 01/12/23 1534   Allergy Injection Flowsheet   Date of Injection 01/12/23 01/12/23   Allergy Vial Identification Number [id number]   Any Reaction to Previous Administration? No No   Any Illness/ Fever in Last 24 hours?* No No   Any Change in Medication? No No   Is the patient on a beta blocker? No No   Does the patient have a valid EpiPen at visit? Yes Yes   Does patient have a history of Asthma? 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 No No   VIAL   Comments 12/24  --

## 2023-01-19 ENCOUNTER — Ambulatory Visit (INDEPENDENT_AMBULATORY_CARE_PROVIDER_SITE_OTHER): Payer: BC Managed Care – PPO

## 2023-01-19 ENCOUNTER — Other Ambulatory Visit: Payer: Self-pay

## 2023-01-19 DIAGNOSIS — J301 Allergic rhinitis due to pollen: Secondary | ICD-10-CM

## 2023-01-19 DIAGNOSIS — J3089 Other allergic rhinitis: Secondary | ICD-10-CM

## 2023-01-19 DIAGNOSIS — Z91038 Other insect allergy status: Secondary | ICD-10-CM

## 2023-01-19 NOTE — Nursing Note (Signed)
01/19/23 1500 01/19/23 1534   Allergy Injection Flowsheet   Date of Injection 01/19/23 01/19/23   Allergy Vial Identification Number [id number]   Any Reaction to Previous Administration? No No   Any Illness/ Fever in Last 24 hours?* No No   Any Change in Medication? No No   Is the patient on a beta blocker? No No   Does the patient have a valid EpiPen at visit? Yes Yes   Does patient have a history of Asthma? 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 No No   VIAL   Comments 12/24  --

## 2023-01-25 ENCOUNTER — Encounter: Payer: Self-pay | Admitting: Radiology

## 2023-01-26 ENCOUNTER — Ambulatory Visit (INDEPENDENT_AMBULATORY_CARE_PROVIDER_SITE_OTHER): Payer: BC Managed Care – PPO

## 2023-01-26 ENCOUNTER — Other Ambulatory Visit: Payer: Self-pay

## 2023-01-26 DIAGNOSIS — Z91038 Other insect allergy status: Secondary | ICD-10-CM

## 2023-01-26 DIAGNOSIS — J3089 Other allergic rhinitis: Secondary | ICD-10-CM

## 2023-01-26 DIAGNOSIS — J301 Allergic rhinitis due to pollen: Secondary | ICD-10-CM

## 2023-01-26 NOTE — Nursing Note (Signed)
01/26/23 1500 01/26/23 1510   Mixed Vials   Comments  --  remix   Allergy Injection Flowsheet   Date of Injection 01/26/23 01/26/23   Allergy Vial Identification Number [id number]   Any Reaction to Previous Administration? No No   Any Illness/ Fever in Last 24 hours?* No No   Any Change in Medication? No No   Is the patient on a beta blocker? No No   Does the patient have a valid EpiPen at visit? Yes Yes   Does patient have a history of Asthma? 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 No No   VIAL   Comments [id number]

## 2023-02-01 ENCOUNTER — Ambulatory Visit (INDEPENDENT_AMBULATORY_CARE_PROVIDER_SITE_OTHER): Payer: BC Managed Care – PPO

## 2023-02-01 ENCOUNTER — Other Ambulatory Visit: Payer: Self-pay

## 2023-02-01 DIAGNOSIS — J3089 Other allergic rhinitis: Secondary | ICD-10-CM

## 2023-02-01 DIAGNOSIS — Z91038 Other insect allergy status: Secondary | ICD-10-CM

## 2023-02-01 DIAGNOSIS — J301 Allergic rhinitis due to pollen: Secondary | ICD-10-CM

## 2023-02-01 NOTE — Nursing Note (Signed)
02/01/23 1400 02/01/23 1414   Allergy Injection Flowsheet   Date of Injection 02/01/23 02/01/23   Allergy Vial Identification Number [id number]   Any Reaction to Previous Administration? No No   Any Illness/ Fever in Last 24 hours?* No No   Any Change in Medication? No No   Is the patient on a beta blocker? No No   Does the patient have a valid EpiPen at visit? Yes Yes   Does patient have a history of Asthma? 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 No No   VIAL   Comments  --  10/2023     Terald Sleeper, LPN

## 2023-02-02 ENCOUNTER — Other Ambulatory Visit (INDEPENDENT_AMBULATORY_CARE_PROVIDER_SITE_OTHER): Payer: Self-pay | Admitting: Otolaryngology

## 2023-02-02 DIAGNOSIS — Z91038 Other insect allergy status: Secondary | ICD-10-CM

## 2023-02-02 DIAGNOSIS — J301 Allergic rhinitis due to pollen: Secondary | ICD-10-CM

## 2023-02-02 DIAGNOSIS — J3089 Other allergic rhinitis: Secondary | ICD-10-CM

## 2023-02-06 ENCOUNTER — Other Ambulatory Visit (INDEPENDENT_AMBULATORY_CARE_PROVIDER_SITE_OTHER): Payer: BC Managed Care – PPO | Admitting: Otolaryngology

## 2023-02-06 DIAGNOSIS — J3089 Other allergic rhinitis: Secondary | ICD-10-CM

## 2023-02-06 DIAGNOSIS — J301 Allergic rhinitis due to pollen: Secondary | ICD-10-CM

## 2023-02-07 NOTE — Nursing Note (Signed)
02/06/23 0839   ENT Immunotherapy Vial Preparation Flowsheet   Allergy Test Date 02/16/22   Immunotherapy Start Date 03/16/22   Type of Immunotherapy? Subcutaneous   Managing Physician Ruchi Stoney   Compounding Staff Tyrone Apple   [place] #1   [place] #1 Identification Number [id number]   Preparation Date 02/06/23   Expiration Date 05/09/23   Immunotherapy Status Increase   Diagnosis Allergic Rhinitis due to pollen (J30.1)   Ragweed, Short 1:20 W/V Lavella Hammock)   Ragweed, Short Initial Endpoint 5   Dilution (10% Glycerin) Used 4   Volume of Dilution Used 0.86m   Lot Number [id number]  Expiration Date 07/10/23   Pigweed, Rough/Redroot 1:20 W/V (Lavella Hammock   Pigweed, Rough/Redroot Initial Endpoint 5   Dilution (10% Glycerin) Used 4   Volume of Dilution Used 0.255m  Lot Number [id number] Expiration Date 07/10/23   Plantain, English 1:20 W/V (GLavella Hammock  Plantain, English Initial Endpoint 5   Dilution (10% Glycerin) Used 4   Volume of Dilution Used 0.2038m Lot Number [id number] Date 07/10/23   [place] 1:20 W/V (GrLavella Hammock [place] Initial Endpoint 4   Dilution (10% Glycerin) Used 3   Volume of Dilution Used 0.97m54mLot Number [id number] Date 07/10/23   [place] 1:20 W/V (GreLavella HammockJohnMorton Amytial Endpoint 5   Dilution (10% Glycerin) Used 4   Volume of Dilution Used 0.97ml58mot Number [id number] Date 07/10/23   Red Oak 1:20 W/V (GreeLavella Hammocked Oak Initial Endpoint 5   Dilution (10% Glycerin) Used 4    Volume of Dilution Used 0.97ml 4mt Number [id number] Date 07/10/23   [place] 1:20 W/V (GreerLavella Hammockx Elder Initial Endpoint 5   Dilution (10% Glycerin) Used 4    Volume of Dilution Used 0.97ml  72m Number [id number] Date 07/10/23   Birch, [place]ee [place]pp [place]/V (Greer)Lavella Hammockch, [place] ISardisl Endpoint 5   Dilution (10% Glycerin) Used 4    Volume of Dilution Used 0.97ml   22mNumber [id number]  [id number] Date 07/10/23   Sycamore, American Eastern 1:20 W/V (Greer) Lavella Hamm[place]more[place] Endpoint 5   Dilution (10% Glycerin) Used 4   Volume of Dilution Used 0.97ml   L80mumber [id number]   [id number] Date 07/10/23   Other Vial #1   Antigen Volume 1.8   Sterile Diluent Normal Saline Volume (Greer) 3.2   Total Volume 5ml   Ste37me Diluent Normal Saline Lot Number (Greer) [id number] Diluent Normal Saline Expiration Date (Greer) 08Lavella Hammock   Vial #2   Vial #2 Identification Number [id number]   [id number] Date 02/06/23   Expiration Date 05/09/23   Immunotherapy Status No escalation   Diagnosis Allergic Rhinitis due to dustmite (J30.89);Allergic Rhinitis due to mold (J30.89)   Dustmite Farinae 10,000 AU/ml (AllerMed)   Dustmite Farinae Initial Endpoint 2    Dilution (10% Glycerin) Used 2   Volume of Dilution Used 0.97ml   [id number] [id number]   [id number] Date 07/10/23   Dustmite Pteronyssinus 10,000 AU/ml (AllerMed)   Dustmite Pteronyssinus Initial Endpoint 4    Dilution (10% Glycerin) Used 4   Volume of Dilution Used 0.97ml   Lot 8mer [id number]   [id number] Date 07/10/23   Alternaria 1:40 W/V (Greer)   Alternaria Initial Endpoint 4  Dilution (10% Glycerin) Used 4   Volume of Dilution Used 0.46m   Lot Number [id number]  Expiration Date 07/10/23   Bipoloris Sorokiniana 1:40 W/V (Greer)   Bipoloris [place]iana Initial Endpoint 3   Dilution (10% Glycerin) Used 3   Volume of Dilution Used 0.282m  Lot Number [id number] Expiration Date 07/10/23   Other Vial #2   Antigen Volume 0.8   Sterile Diluent Normal Saline Volume (Greer) 4.2   Total Volume 35m73m Sterile Diluent Normal Saline Lot Number (GrLavella [id number] Diluent Normal Saline Expiration Date  (GrLavella Hammock8/31/27   2 (10 unit) vials mixed.

## 2023-02-12 ENCOUNTER — Other Ambulatory Visit: Payer: Self-pay

## 2023-02-12 ENCOUNTER — Ambulatory Visit (INDEPENDENT_AMBULATORY_CARE_PROVIDER_SITE_OTHER): Payer: BC Managed Care – PPO

## 2023-02-12 DIAGNOSIS — J3089 Other allergic rhinitis: Secondary | ICD-10-CM

## 2023-02-12 DIAGNOSIS — Z91038 Other insect allergy status: Secondary | ICD-10-CM

## 2023-02-12 DIAGNOSIS — J301 Allergic rhinitis due to pollen: Secondary | ICD-10-CM

## 2023-02-12 NOTE — Nursing Note (Signed)
02/12/23 1500 02/12/23 1537   Mixed Vials   Allergy Vial #1 Date Mixed 02/12/23  --    Allergy Vial # 1 Identification Number [id number]  --    Test Wheal Vial #1 wheal  --    Comments 65mm  (mixed [id number])  --    Allergy Injection Flowsheet   Date of Injection 02/12/23 02/12/23   Allergy Vial Identification Number [id number]   Any Reaction to Previous Administration? No No   Any Illness/ Fever in Last 24 hours?* No No   Any Change in Medication? No No   Is the patient on a beta blocker? No No   Does the patient have a valid EpiPen at visit? Yes Yes   Does patient have a history of Asthma? 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 No No   VIAL   Comments  --  [id number]       I have reviewed the above allergy vial safety test, give injection.  Junious Dresser, APRN,NP-C

## 2023-02-23 ENCOUNTER — Other Ambulatory Visit: Payer: Self-pay

## 2023-02-23 ENCOUNTER — Ambulatory Visit (INDEPENDENT_AMBULATORY_CARE_PROVIDER_SITE_OTHER): Payer: Self-pay

## 2023-02-23 ENCOUNTER — Ambulatory Visit (INDEPENDENT_AMBULATORY_CARE_PROVIDER_SITE_OTHER): Payer: BC Managed Care – PPO

## 2023-02-23 DIAGNOSIS — Z91038 Other insect allergy status: Secondary | ICD-10-CM

## 2023-02-23 DIAGNOSIS — J301 Allergic rhinitis due to pollen: Secondary | ICD-10-CM

## 2023-02-23 DIAGNOSIS — J3089 Other allergic rhinitis: Secondary | ICD-10-CM

## 2023-02-23 NOTE — Nursing Note (Signed)
02/23/23 1300 02/23/23 1313   Mixed Vials   Comments Repeated dose due to time  --    Allergy Injection Flowsheet   Date of Injection 02/23/23 02/23/23   Allergy Vial Identification Number [id number]   Any Reaction to Previous Administration? No No   Any Illness/ Fever in Last 24 hours?* No No   Any Change in Medication? No No   Is the patient on a beta blocker? No No   Does the patient have a valid EpiPen at visit? Yes Yes   Does patient have a history of Asthma? 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 No No   VIAL   Comments  --  10/2023

## 2023-03-02 ENCOUNTER — Other Ambulatory Visit: Payer: Self-pay

## 2023-03-02 ENCOUNTER — Ambulatory Visit (INDEPENDENT_AMBULATORY_CARE_PROVIDER_SITE_OTHER): Payer: BC Managed Care – PPO

## 2023-03-02 DIAGNOSIS — Z91038 Other insect allergy status: Secondary | ICD-10-CM

## 2023-03-02 DIAGNOSIS — J3089 Other allergic rhinitis: Secondary | ICD-10-CM

## 2023-03-02 DIAGNOSIS — J301 Allergic rhinitis due to pollen: Secondary | ICD-10-CM

## 2023-03-02 NOTE — Nursing Note (Signed)
03/02/23 1500 03/02/23 1520   Allergy Injection Flowsheet   Date of Injection 03/02/23 03/02/23   Allergy Vial Identification Number [id number]   Any Reaction to Previous Administration? No No   Any Illness/ Fever in Last 24 hours?* No No   Any Change in Medication? No No   Is the patient on a beta blocker? No No   Does the patient have a valid EpiPen at visit? Yes Yes   Does patient have a history of Asthma? 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 No No   VIAL   Comments 12/24  --

## 2023-03-09 ENCOUNTER — Other Ambulatory Visit: Payer: Self-pay

## 2023-03-09 ENCOUNTER — Ambulatory Visit (INDEPENDENT_AMBULATORY_CARE_PROVIDER_SITE_OTHER): Payer: BC Managed Care – PPO

## 2023-03-09 DIAGNOSIS — Z91038 Other insect allergy status: Secondary | ICD-10-CM

## 2023-03-09 DIAGNOSIS — J301 Allergic rhinitis due to pollen: Secondary | ICD-10-CM

## 2023-03-09 DIAGNOSIS — J3089 Other allergic rhinitis: Secondary | ICD-10-CM

## 2023-03-09 NOTE — Nursing Note (Signed)
03/09/23 1500 03/09/23 1513   Allergy Injection Flowsheet   Date of Injection 03/09/23 03/09/23   Allergy Vial Identification Number [id number]   Any Reaction to Previous Administration? No No   Any Illness/ Fever in Last 24 hours?* No No   Any Change in Medication? No No   Is the patient on a beta blocker? No No   Does the patient have a valid EpiPen at visit? Yes Yes   Does patient have a history of Asthma? 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 No No   VIAL   Comments  --  10/2023       Julio Storr Feather, LPN

## 2023-03-16 ENCOUNTER — Other Ambulatory Visit: Payer: Self-pay

## 2023-03-16 ENCOUNTER — Ambulatory Visit (INDEPENDENT_AMBULATORY_CARE_PROVIDER_SITE_OTHER): Payer: BC Managed Care – PPO

## 2023-03-16 DIAGNOSIS — Z91038 Other insect allergy status: Secondary | ICD-10-CM

## 2023-03-16 DIAGNOSIS — J301 Allergic rhinitis due to pollen: Secondary | ICD-10-CM

## 2023-03-16 DIAGNOSIS — J3089 Other allergic rhinitis: Secondary | ICD-10-CM

## 2023-03-16 NOTE — Nursing Note (Signed)
03/16/23 1500 03/16/23 1506   Allergy Injection Flowsheet   Date of Injection 03/16/23 03/16/23   Allergy Vial Identification Number [id number]   Any Reaction to Previous Administration? No No   Any Illness/ Fever in Last 24 hours?* No No   Any Change in Medication? No No   Is the patient on a beta blocker? No No   Does the patient have a valid EpiPen at visit? Yes Yes   Does patient have a history of Asthma? No No   Expiration date 05/09/23 05/09/23   Dose 0.1ml 0.6ml   Maintenance Dose 0.55ml 0.62ml   Site Left arm Right arm   Patient tolerated procedure well yes yes   Allergy Diagnosis J30.1-allergic rhinitis due to pollen J30.89-allergic rhinitis due to dust mite;J30.89 allergic rhinitis due to mold   Time given 1508 1508   Time read 1528 1528   Patient declined to wait 20 minutes No No   Managing Physician Ramadan Ramadan   Initials dh dh   Were the vials mailed? No No   VIAL   Comments  --  10/2023     Jackelyn Knife, LPN

## 2023-03-23 ENCOUNTER — Ambulatory Visit (INDEPENDENT_AMBULATORY_CARE_PROVIDER_SITE_OTHER): Payer: BC Managed Care – PPO

## 2023-03-23 ENCOUNTER — Other Ambulatory Visit: Payer: Self-pay

## 2023-03-23 DIAGNOSIS — Z91038 Other insect allergy status: Secondary | ICD-10-CM

## 2023-03-23 DIAGNOSIS — J3089 Other allergic rhinitis: Secondary | ICD-10-CM

## 2023-03-23 DIAGNOSIS — J301 Allergic rhinitis due to pollen: Secondary | ICD-10-CM

## 2023-03-23 NOTE — Nursing Note (Signed)
03/23/23 1500 03/23/23 1521   Mixed Vials   Comments  --  Pt states next injection he will be taking his vials with him to a location closer to home.   Allergy Injection Flowsheet   Date of Injection 03/23/23 03/23/23   Allergy Vial Identification Number [id number]   Any Reaction to Previous Administration? No No   Any Illness/ Fever in Last 24 hours?* No No   Any Change in Medication? No No   Is the patient on a beta blocker? No No   Does the patient have a valid EpiPen at visit? Yes Yes   Does patient have a history of Asthma? 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 No No   VIAL   Comments  --  10/2023       Tayo Maute Feather, LPN

## 2023-03-27 ENCOUNTER — Ambulatory Visit (INDEPENDENT_AMBULATORY_CARE_PROVIDER_SITE_OTHER): Payer: Self-pay | Admitting: Otolaryngology

## 2023-03-27 ENCOUNTER — Other Ambulatory Visit: Payer: Self-pay

## 2023-03-27 ENCOUNTER — Encounter (INDEPENDENT_AMBULATORY_CARE_PROVIDER_SITE_OTHER): Payer: Self-pay | Admitting: Otolaryngology

## 2023-03-27 ENCOUNTER — Ambulatory Visit (INDEPENDENT_AMBULATORY_CARE_PROVIDER_SITE_OTHER): Payer: BC Managed Care – PPO | Admitting: Otolaryngology

## 2023-03-27 VITALS — BP 128/80 | HR 76 | Temp 97.8°F | Ht 72.0 in | Wt 179.0 lb

## 2023-03-27 DIAGNOSIS — J3089 Other allergic rhinitis: Secondary | ICD-10-CM

## 2023-03-27 DIAGNOSIS — Z91038 Other insect allergy status: Secondary | ICD-10-CM

## 2023-03-27 DIAGNOSIS — J301 Allergic rhinitis due to pollen: Secondary | ICD-10-CM

## 2023-03-27 DIAGNOSIS — J309 Allergic rhinitis, unspecified: Secondary | ICD-10-CM

## 2023-03-27 DIAGNOSIS — J343 Hypertrophy of nasal turbinates: Secondary | ICD-10-CM

## 2023-03-27 MED ORDER — MONTELUKAST 10 MG TABLET
10.0000 mg | ORAL_TABLET | Freq: Every evening | ORAL | 3 refills | Status: AC
Start: 2023-03-27 — End: ?

## 2023-03-27 NOTE — Telephone Encounter (Signed)
Note placed in flowsheet.           Schaefer, Cristy, MA  Delane Wessinger, RN  Ok per Dr Ramadan to increase right arm only next mix

## 2023-03-27 NOTE — Progress Notes (Signed)
ENT, [facility]  [address] [place]  [place] [address]  [phone]    PATIENT NAME:  Tyler Navarro  MRN:  [id number]  DOB:  [birth year]  DATE OF SERVICE: 03/27/2023    Chief Complaint:  Follow Up      HPI:  Tyler Navarro is a 22 y.o. gentleman with allergic rhinitis. He was diagnosed with it about a year ago. We did start him on shots. It seems he is doing fairly well. They are very significant and severe. There are 2 of them that I did not include even in the shots. Last visit, we did escalate the left arm; it seems he tolerated it okay. He does notice slight improvement with his symptoms. No reactions to the shots.       Medications:  Outpatient Medications Marked as Taking for the 03/27/23 encounter (Office Visit) with Ulyssa Walthour, Thom Chimes, MD   Medication Sig   . cetirizine (ZYRTEC) 10 mg Oral Tablet Take 1 Tablet (10 mg total) by mouth Once a day   . fexofenadine (ALLEGRA) 180 mg Oral Tablet Take 1 Tablet (180 mg total) by mouth Once a day   . montelukast (SINGULAIR) 10 mg Oral Tablet Take 1 Tablet (10 mg total) by mouth Every evening       Allergies:  Allergies   Allergen Reactions   . Grass Pollen    . House Dust    . Tree And Shrub Pollen          Physical Exam:  Blood pressure 128/80, pulse 76, temperature 36.6 C (97.8 F), height 1.829 m (6'), weight 81.2 kg (179 lb 0.2 oz), SpO2 98%.  Body mass index is 24.28 kg/m.  General Appearance: Pleasant, cooperative, healthy, and in no acute distress.  Eyes: Conjunctivae/corneas clear.  Head and Face: Face symmetric, no obvious lesions.   External auditory canals:  Patent without inflammation.  Tympanic membranes:  Intact, translucent, midposition, middle ear aerated.  Nose: Some turbinate hypertrophy; otherwise, clear.    Oral Cavity/Oropharynx: No mucosal lesions, masses, or pharyngeal asymmetry.    Procedure:        Data Reviewed:    Allergy shot reviewed.     Assessment:      ICD-10-CM    1. Allergic rhinitis  J30.9       2. Nasal  turbinate hypertrophy  J34.3       3. Seasonal allergic rhinitis due to pollen  J30.1 montelukast (SINGULAIR) 10 mg Oral Tablet      4. Allergic rhinitis due to dust mite  J30.89 montelukast (SINGULAIR) 10 mg Oral Tablet      5. 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 If he seems to be tolerating things well, we will proceed with escalating both next time.  I will see him in 4 months.     Thom Chimes Vicenta Olds, MD

## 2023-03-29 ENCOUNTER — Ambulatory Visit (INDEPENDENT_AMBULATORY_CARE_PROVIDER_SITE_OTHER): Payer: BC Managed Care – PPO

## 2023-03-29 ENCOUNTER — Other Ambulatory Visit: Payer: Self-pay

## 2023-03-29 DIAGNOSIS — Z91038 Other insect allergy status: Secondary | ICD-10-CM

## 2023-03-29 DIAGNOSIS — J3089 Other allergic rhinitis: Secondary | ICD-10-CM

## 2023-03-29 DIAGNOSIS — J301 Allergic rhinitis due to pollen: Secondary | ICD-10-CM

## 2023-03-29 NOTE — Nursing Note (Signed)
03/29/23 1500 03/29/23 1505   Mixed Vials   Comments Pt taking vials with him to outside clinic for administration. Instructions with updated dosing also sent. Leanne aware.  --    Allergy Injection Flowsheet   Date of Injection 03/29/23 03/29/23   Allergy Vial Identification Number [id number]   Any Reaction to Previous Administration? No No   Any Illness/ Fever in Last 24 hours?* No No   Any Change in Medication? No No   Is the patient on a beta blocker? No No   Does the patient have a valid EpiPen at visit? Yes Yes   Does patient have a history of Asthma? 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 No No   VIAL   Comments 12/24  --

## 2023-04-06 ENCOUNTER — Ambulatory Visit (INDEPENDENT_AMBULATORY_CARE_PROVIDER_SITE_OTHER): Payer: BC Managed Care – PPO

## 2023-04-06 DIAGNOSIS — J309 Allergic rhinitis, unspecified: Secondary | ICD-10-CM | POA: Diagnosis not present

## 2023-04-12 ENCOUNTER — Encounter (HOSPITAL_COMMUNITY): Payer: Self-pay

## 2023-04-18 ENCOUNTER — Ambulatory Visit (INDEPENDENT_AMBULATORY_CARE_PROVIDER_SITE_OTHER): Payer: BC Managed Care – PPO

## 2023-04-18 DIAGNOSIS — J309 Allergic rhinitis, unspecified: Secondary | ICD-10-CM | POA: Diagnosis not present

## 2023-04-25 ENCOUNTER — Ambulatory Visit (INDEPENDENT_AMBULATORY_CARE_PROVIDER_SITE_OTHER): Payer: BC Managed Care – PPO

## 2023-04-25 DIAGNOSIS — J309 Allergic rhinitis, unspecified: Secondary | ICD-10-CM

## 2023-04-26 ENCOUNTER — Other Ambulatory Visit (INDEPENDENT_AMBULATORY_CARE_PROVIDER_SITE_OTHER): Payer: Self-pay | Admitting: Otolaryngology

## 2023-04-26 DIAGNOSIS — J3089 Other allergic rhinitis: Secondary | ICD-10-CM

## 2023-04-26 DIAGNOSIS — J301 Allergic rhinitis due to pollen: Secondary | ICD-10-CM

## 2023-04-26 NOTE — Addendum Note (Signed)
Addended by: Tempie Hoist on: 04/26/2023 10:33 AM     Modules accepted: Orders

## 2023-05-02 ENCOUNTER — Ambulatory Visit (INDEPENDENT_AMBULATORY_CARE_PROVIDER_SITE_OTHER): Payer: BC Managed Care – PPO

## 2023-05-02 ENCOUNTER — Encounter (INDEPENDENT_AMBULATORY_CARE_PROVIDER_SITE_OTHER): Payer: BC Managed Care – PPO

## 2023-05-02 DIAGNOSIS — J309 Allergic rhinitis, unspecified: Secondary | ICD-10-CM | POA: Diagnosis not present

## 2023-05-03 ENCOUNTER — Other Ambulatory Visit (INDEPENDENT_AMBULATORY_CARE_PROVIDER_SITE_OTHER): Payer: BC Managed Care – PPO | Admitting: Otolaryngology

## 2023-05-03 DIAGNOSIS — J3089 Other allergic rhinitis: Secondary | ICD-10-CM

## 2023-05-03 DIAGNOSIS — J301 Allergic rhinitis due to pollen: Secondary | ICD-10-CM

## 2023-05-07 NOTE — Nursing Note (Signed)
05/03/23 1520   ENT Immunotherapy Vial Preparation Flowsheet   Allergy Test Date 02/16/22   Immunotherapy Start Date 03/16/22   Type of Immunotherapy? Subcutaneous   Managing Physician Ramadan   Compounding Staff Waymond Cera   Initials CS   Vial #1   [place] #1 Identification Number [id number]   Preparation Date 05/03/23   Expiration Date 08/03/23   Immunotherapy Status No escalation   Diagnosis Allergic Rhinitis due to pollen (J30.1)   Ragweed, Short 1:20 W/V   Ragweed, Short Initial Endpoint 5   Dilution (10% Glycerin) Used 4   Volume of Dilution Used 0.85ml   Lot Number [id number]   Expiration Date 08/26/23   Manufacturer ALK   Pigweed, Rough/Redroot 1:20 W/V   Pigweed, Rough/Redroot Initial Endpoint 5   Dilution (10% Glycerin) Used 4   Volume of Dilution Used 0.60ml   Lot Number [id number]   Expiration Date 08/26/23   Manufacturer ALK   Plantain, English 1:20 W/V   Plantain, English Initial Endpoint 5   Dilution (10% Glycerin) Used 4   Volume of Dilution Used 0.65ml   Lot Number [id number]   Expiration Date 08/26/23   Manufacturer ALK   Lamb's Quarters 1:20 W/V   Lamb's Quarters Initial Endpoint 4   Dilution (10% Glycerin) Used 3   Volume of Dilution Used 0.37ml   Lot Number [id number]   Expiration Date 08/26/23   Manufacturer ALK   [place] Grass 1:20 W/V   [place] Grass Initial Endpoint 5   Dilution (10% Glycerin) Used 4   Volume of Dilution Used 0.1ml   Lot Number [id number]   Expiration Date 08/26/23   Manufacturer ALK   Red Oak 1:20 W/V   Red Oak Initial Endpoint 5   Dilution (10% Glycerin) Used 4    Volume of Dilution Used 0.56ml   Lot Number [id number]   Expiration Date 08/26/23   Manufacturer ALK   [place] 1:20 W/V   [place] Initial Endpoint 5   Dilution (10% Glycerin) Used 4    Volume of Dilution Used 0.32ml   Lot Number [id number]   Expiration Date 08/26/23   Manufacturer ALK   Charletta Cousin, Red 1:20 W/V   Charletta Cousin, Red Initial Endpoint 5   Dilution (10% Glycerin) Used 4    Volume of Dilution Used 0.64ml   Lot  Number [id number]   Expiration Date 08/26/23   Manufacturer ALK   Sycamore, American Eastern 1:20 W/V   [place], [place] Initial Endpoint 5   Dilution (10% Glycerin) Used 4   Volume of Dilution Used 0.11ml   Lot Number [id number]   Expiration Date 08/26/23   Manufacturer ALK   Other Vial #1   Antigen Volume 1.8   Sterile Diluent Normal Saline Volume 3.2   Total Volume 5ml   Sterile Diluent Normal Saline Lot Number [id number]   Sterile Diluent Normal Saline Expiration Date 08/26/25   Sterile Diluent Normal Saline Manufacturer ALK   Ceasar Mons #2 Identification Number [id number]   Preparation Date 05/03/23   Expiration Date 08/03/23   Immunotherapy Status No escalation   Diagnosis Allergic Rhinitis due to dustmite (J30.89);Allergic Rhinitis due to mold (J30.89)   Dustmite Farinae 10,000 AU/ml   Dustmite Farinae Initial Endpoint 2    Dilution (10% Glycerin) Used 2   Volume of Dilution Used 0.54ml   Lot Number [id number]   Expiration Date 08/26/23   Manufacturer ALK   Dustmite Pteronyssinus 10,000 AU/ml   Dustmite Pteronyssinus Initial Endpoint 4  Dilution (10% Glycerin) Used 4   Volume of Dilution Used 0.28ml   Lot Number [id number]   Expiration Date 08/26/23   Manufacturer ALK   Alternaria 1:40 W/V   Alternaria Initial Endpoint 4    Dilution (10% Glycerin) Used 4   Volume of Dilution Used 0.39ml   Lot Number [id number]   Expiration Date 08/26/23   Manufacturer ALK   Bipoloris Sorokiniana 1:40 W/V   Bipoloris Sorokiniana Initial Endpoint 3   Dilution (10% Glycerin) Used 3   Volume of Dilution Used 0.80ml   Lot Number [id number]   Expiration Date 08/26/23   Manufacturer ALK   Other Vial #2   Antigen Volume 0.8   Sterile Diluent Normal Saline Volume 4.2   Total Volume 5ml   Sterile Diluent Normal Saline Lot Number [id number]   Sterile Diluent Normal Saline Expiration Date 08/26/25   Sterile Diluent Normal Saline Manufacturer ALK   2 (10 unit) vials mixed.    Supervising physician:     Merrily Brittle,  MD, PhD 05/22/2023, 16:53  [facility]

## 2023-05-09 ENCOUNTER — Ambulatory Visit (INDEPENDENT_AMBULATORY_CARE_PROVIDER_SITE_OTHER): Payer: BC Managed Care – PPO

## 2023-05-09 ENCOUNTER — Ambulatory Visit (INDEPENDENT_AMBULATORY_CARE_PROVIDER_SITE_OTHER): Payer: Self-pay | Admitting: Otolaryngology

## 2023-05-09 DIAGNOSIS — J309 Allergic rhinitis, unspecified: Secondary | ICD-10-CM

## 2023-05-09 NOTE — Telephone Encounter (Signed)
-----   Message from Robynn Pane, RN sent at 05/09/2023  3:10 PM EDT -----  Regarding: FW: NAV / DR. Rico Ala  ----- Message from Eudelia Bunch sent at 05/09/2023  3:03 PM EDT -----  Patient needs NAV mailed to:  [facility]  [address]  [place], [place] [address]    PH: [phone]    Patient will need next Allergy Injection Wednesday 06.19.24 OR 06.21.24,  Thank You!     I looked the information up to make correct, hard time hearing patient,   on internet listed as: [facility].Marland Kitchen

## 2023-05-09 NOTE — Telephone Encounter (Signed)
Vials mailed to provided address.

## 2023-05-16 ENCOUNTER — Ambulatory Visit (INDEPENDENT_AMBULATORY_CARE_PROVIDER_SITE_OTHER): Payer: BC Managed Care – PPO

## 2023-05-16 DIAGNOSIS — J309 Allergic rhinitis, unspecified: Secondary | ICD-10-CM | POA: Diagnosis not present

## 2023-05-30 ENCOUNTER — Ambulatory Visit (INDEPENDENT_AMBULATORY_CARE_PROVIDER_SITE_OTHER): Payer: BC Managed Care – PPO

## 2023-05-30 DIAGNOSIS — J309 Allergic rhinitis, unspecified: Secondary | ICD-10-CM

## 2023-06-06 ENCOUNTER — Ambulatory Visit (INDEPENDENT_AMBULATORY_CARE_PROVIDER_SITE_OTHER): Payer: BC Managed Care – PPO

## 2023-06-06 DIAGNOSIS — J309 Allergic rhinitis, unspecified: Secondary | ICD-10-CM

## 2023-06-13 ENCOUNTER — Ambulatory Visit (INDEPENDENT_AMBULATORY_CARE_PROVIDER_SITE_OTHER): Payer: BC Managed Care – PPO

## 2023-06-13 DIAGNOSIS — J309 Allergic rhinitis, unspecified: Secondary | ICD-10-CM

## 2023-06-22 ENCOUNTER — Ambulatory Visit: Payer: Self-pay

## 2023-06-22 DIAGNOSIS — J309 Allergic rhinitis, unspecified: Secondary | ICD-10-CM

## 2023-06-27 ENCOUNTER — Ambulatory Visit (INDEPENDENT_AMBULATORY_CARE_PROVIDER_SITE_OTHER): Payer: BC Managed Care – PPO

## 2023-06-27 DIAGNOSIS — J309 Allergic rhinitis, unspecified: Secondary | ICD-10-CM

## 2023-07-06 ENCOUNTER — Ambulatory Visit (INDEPENDENT_AMBULATORY_CARE_PROVIDER_SITE_OTHER): Payer: BC Managed Care – PPO

## 2023-07-06 DIAGNOSIS — J309 Allergic rhinitis, unspecified: Secondary | ICD-10-CM

## 2023-07-11 ENCOUNTER — Ambulatory Visit (INDEPENDENT_AMBULATORY_CARE_PROVIDER_SITE_OTHER): Payer: BC Managed Care – PPO

## 2023-07-11 DIAGNOSIS — J309 Allergic rhinitis, unspecified: Secondary | ICD-10-CM

## 2023-07-11 NOTE — Progress Notes (Signed)
Immunotherapy   Patient Details  Name: Tracy [name] MRN: [id number] Date of Birth: July 31, 2001  07/11/2023  Alveda Reasons here to pick up  POLLEN (PINK VIAL) / DM (BLUE VIAL)  Following schedule: A  Frequency:1 time per week Epi-Pen:Epi-Pen Available   patient instructions and flowsheet provided with vials.   Orson Aloe 07/11/2023, 5:53 PM

## 2023-07-11 NOTE — Progress Notes (Deleted)
   Established Patient Office Visit  Subjective   Patient ID: Tracy [name], male    DOB: 13-Aug-2001  Age: 22 y.o. MRN: [id number]  No chief complaint on file.   HPI  {History (Optional):23778}  ROS    Objective:     There were no vitals taken for this visit. {Vitals History (Optional):23777}  Physical Exam   No results found for any visits on 07/11/23.  {Labs (Optional):23779}  The ASCVD Risk score (Arnett DK, et al., 2019) failed to calculate for the following reasons:   The 2019 ASCVD risk score is only valid for ages 38 to 4    Assessment & Plan:   Problem List Items Addressed This Visit   None Visit Diagnoses     Allergic rhinitis, unspecified seasonality, unspecified trigger    -  Primary       No follow-ups on file.    Orson Aloe, CMA

## 2023-07-17 ENCOUNTER — Encounter (INDEPENDENT_AMBULATORY_CARE_PROVIDER_SITE_OTHER): Payer: Self-pay | Admitting: Otolaryngology

## 2023-07-17 ENCOUNTER — Ambulatory Visit (INDEPENDENT_AMBULATORY_CARE_PROVIDER_SITE_OTHER): Payer: Self-pay

## 2023-07-17 NOTE — Telephone Encounter (Signed)
Pt was told there would not be enough serum for next injection, but vials still appear to have enough for several more injections. Spoke to outside office. Patient's last injection was 0.82ml dosage from both vial 1 and vial 2 on 07/11/2023. Per outside office, there is no reason they are aware of that the patient would have been told there was not enough remaining serum for next injection and no documentation of issue with vial. Will continue build up.    Outside office to fax records of injections from when he was receiving injections at their office this summer.

## 2023-07-19 ENCOUNTER — Ambulatory Visit (INDEPENDENT_AMBULATORY_CARE_PROVIDER_SITE_OTHER): Payer: BC Managed Care – PPO

## 2023-07-19 ENCOUNTER — Other Ambulatory Visit: Payer: Self-pay

## 2023-07-19 DIAGNOSIS — J3089 Other allergic rhinitis: Secondary | ICD-10-CM

## 2023-07-19 DIAGNOSIS — Z91038 Other insect allergy status: Secondary | ICD-10-CM

## 2023-07-19 DIAGNOSIS — J301 Allergic rhinitis due to pollen: Secondary | ICD-10-CM

## 2023-07-19 NOTE — Nursing Note (Signed)
07/19/23 1400 07/19/23 1436   Allergy Injection Flowsheet   Date of Injection 07/19/23 07/19/23   Allergy Vial Identification Number [id number]   Any Reaction to Previous Administration? No No   Any Illness/ Fever in Last 24 hours?* No No   Any Change in Medication? No No   Is the patient on a beta blocker? No No   Does the patient have a valid EpiPen at visit? Yes Yes   Does patient have a history of Asthma? 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 No No   VIAL   Comments  --  12/24

## 2023-07-26 ENCOUNTER — Ambulatory Visit (INDEPENDENT_AMBULATORY_CARE_PROVIDER_SITE_OTHER): Payer: BC Managed Care – PPO

## 2023-07-26 ENCOUNTER — Other Ambulatory Visit: Payer: Self-pay

## 2023-07-26 DIAGNOSIS — J3089 Other allergic rhinitis: Secondary | ICD-10-CM

## 2023-07-26 DIAGNOSIS — J301 Allergic rhinitis due to pollen: Secondary | ICD-10-CM

## 2023-07-26 DIAGNOSIS — Z91038 Other insect allergy status: Secondary | ICD-10-CM

## 2023-07-26 NOTE — Nursing Note (Signed)
07/26/23 1400 07/26/23 1409   Allergy Injection Flowsheet   Date of Injection 07/26/23 07/26/23   Allergy Vial Identification Number [id number]   Any Reaction to Previous Administration? No No   Any Illness/ Fever in Last 24 hours?* No No   Any Change in Medication? No No   Is the patient on a beta blocker? No No   Does the patient have a valid EpiPen at visit? Yes Yes   Does patient have a history of Asthma? 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 No No   VIAL   Comments  --  12/24

## 2023-07-31 ENCOUNTER — Other Ambulatory Visit (INDEPENDENT_AMBULATORY_CARE_PROVIDER_SITE_OTHER): Payer: Self-pay | Admitting: Otolaryngology

## 2023-07-31 DIAGNOSIS — J3089 Other allergic rhinitis: Secondary | ICD-10-CM

## 2023-07-31 DIAGNOSIS — J301 Allergic rhinitis due to pollen: Secondary | ICD-10-CM

## 2023-07-31 DIAGNOSIS — Z91038 Other insect allergy status: Secondary | ICD-10-CM

## 2023-08-01 ENCOUNTER — Other Ambulatory Visit (INDEPENDENT_AMBULATORY_CARE_PROVIDER_SITE_OTHER): Payer: Self-pay | Admitting: Otolaryngology

## 2023-08-01 DIAGNOSIS — J301 Allergic rhinitis due to pollen: Secondary | ICD-10-CM

## 2023-08-01 DIAGNOSIS — J3089 Other allergic rhinitis: Secondary | ICD-10-CM

## 2023-08-02 ENCOUNTER — Other Ambulatory Visit: Payer: Self-pay

## 2023-08-02 ENCOUNTER — Ambulatory Visit (INDEPENDENT_AMBULATORY_CARE_PROVIDER_SITE_OTHER): Payer: BC Managed Care – PPO

## 2023-08-02 DIAGNOSIS — J3089 Other allergic rhinitis: Secondary | ICD-10-CM

## 2023-08-02 DIAGNOSIS — Z91038 Other insect allergy status: Secondary | ICD-10-CM

## 2023-08-02 DIAGNOSIS — J301 Allergic rhinitis due to pollen: Secondary | ICD-10-CM

## 2023-08-02 NOTE — Nursing Note (Signed)
08/01/23 1025   ENT Immunotherapy Vial Preparation Flowsheet   Allergy Test Date 02/16/22   Immunotherapy Start Date 03/16/22   Type of Immunotherapy? Subcutaneous   Managing Physician Shaleigh Laubscher   Compounding Staff Otis Brace CS   Vial #1   [place] #1 Identification Number [id number]   Preparation Date 08/01/23   Expiration Date 10/31/23   Immunotherapy Status No escalation   Diagnosis Allergic Rhinitis due to pollen (J30.1)   Ragweed, Short 1:20 W/V   Ragweed, Short Initial Endpoint 5   Dilution (10% Glycerin) Used 4   Volume of Dilution Used 0.29ml   Lot Number [id number]   Expiration Date 11/22/23   Manufacturer ALK   Pigweed, Rough/Redroot 1:20 W/V   Pigweed, Rough/Redroot Initial Endpoint 5   Dilution (10% Glycerin) Used 4   Volume of Dilution Used 0.23ml   Lot Number [id number]   Expiration Date 11/22/23   Manufacturer ALK   Plantain, English 1:20 W/V   Plantain, English Initial Endpoint 5   Dilution (10% Glycerin) Used 4   Volume of Dilution Used 0.36ml   Lot Number [id number]   Expiration Date 11/22/23   Manufacturer ALK   Lamb's Quarters 1:20 W/V   Lamb's Quarters Initial Endpoint 4   Dilution (10% Glycerin) Used 3   Volume of Dilution Used 0.25ml   Lot Number [id number]   Expiration Date 11/22/23   Manufacturer ALK   Johnson Grass 1:20 W/V   Johnson Grass Initial Endpoint 5   Dilution (10% Glycerin) Used 4   Volume of Dilution Used 0.52ml   Lot Number [id number]   Expiration Date 11/22/23   Manufacturer ALK   Red Oak 1:20 W/V   Red Oak Initial Endpoint 5   Dilution (10% Glycerin) Used 4    Volume of Dilution Used 0.67ml   Lot Number [id number]   Expiration Date 11/22/23   Manufacturer ALK   Box Elder 1:20 W/V   [place] Initial Endpoint 5   Dilution (10% Glycerin) Used 4    Volume of Dilution Used 0.19ml   Lot Number [id number]   Expiration Date 11/22/23   Manufacturer ALK   Charletta Cousin, Red 1:20 W/V   Charletta Cousin, Red Initial Endpoint 5   Dilution (10% Glycerin) Used 4    Volume of Dilution Used 0.23ml    Lot Number [id number]   Expiration Date 11/22/23   Manufacturer ALK   Sycamore, American Eastern 1:20 W/V   [place], [place] Initial Endpoint 5   Dilution (10% Glycerin) Used 4   Volume of Dilution Used 0.23ml   Lot Number [id number]   Expiration Date 11/22/23   Manufacturer ALK   Other Vial #1   Antigen Volume 1.8   Sterile Diluent Normal Saline Volume 3.2   Total Volume 5ml   Sterile Diluent Normal Saline Lot Number [id number]   Sterile Diluent Normal Saline Expiration Date 12/27/25   Sterile Diluent Normal Saline Manufacturer ALK   Ceasar Mons #2 Identification Number [id number]   Preparation Date 08/01/23   Expiration Date 10/31/23   Immunotherapy Status Increase   Diagnosis Allergic Rhinitis due to dustmite (J30.89);Allergic Rhinitis due to mold (J30.89)   Dustmite Farinae 10,000 AU/ml   Dustmite Farinae Initial Endpoint 2    Dilution (10% Glycerin) Used 1   Volume of Dilution Used 0.79ml   Lot Number [id number]   Expiration Date 11/22/23   Manufacturer ALK   Dustmite Pteronyssinus 10,000 AU/ml   Dustmite Pteronyssinus Initial Endpoint 4  Dilution (10% Glycerin) Used 3   Volume of Dilution Used 0.56ml   Lot Number [id number]   Expiration Date 11/22/23   Manufacturer ALK   Alternaria 1:40 W/V   Alternaria Initial Endpoint 4    Dilution (10% Glycerin) Used 3   Volume of Dilution Used 0.56ml   Lot Number [id number]   Expiration Date 11/22/23   Manufacturer ALK   Bipoloris Sorokiniana 1:40 W/V   Bipoloris Sorokiniana Initial Endpoint 3   Dilution (10% Glycerin) Used 2   Volume of Dilution Used 0.60ml   Lot Number [id number]   Expiration Date 11/22/23   Manufacturer ALK   Other Vial #2   Antigen Volume 0.8   Sterile Diluent Normal Saline Volume 4.2   Total Volume 5ml   Sterile Diluent Normal Saline Lot Number [id number]   Sterile Diluent Normal Saline Expiration Date 12/27/25   Sterile Diluent Normal Saline Manufacturer ALK   2 (10 unit) vials mixed.

## 2023-08-02 NOTE — Nursing Note (Signed)
08/02/23 1400 08/02/23 1429   Mixed Vials   Comments old vials  --    Allergy Injection Flowsheet   Date of Injection 08/02/23 08/02/23   Allergy Vial Identification Number [id number]   Any Reaction to Previous Administration? No No   Any Illness/ Fever in Last 24 hours?* No No   Any Change in Medication? No No   Is the patient on a beta blocker? No No   Does the patient have a valid EpiPen at visit? Yes Yes   Does patient have a history of Asthma? No No   Expiration date 08/03/23 08/03/23   Dose 0.34ml 0.53ml   Maintenance Dose 0.56ml 0.85ml   Site Left arm Right arm   Patient tolerated procedure well yes yes   Allergy Diagnosis J30.1 - allergic rhinitis due to pollen J30.89 - allergic rhinitis due to dust mite;J30.89 - allergic rhinitis due to mold   Time given 1436 1436   Time read 1456 1456   Patient declined to wait 20 minutes No No   Managing Physician Ramadan Ramadan   Initials jlg jlg   Were the vials mailed? No No   VIAL   Comments  --  10/2023

## 2023-08-09 ENCOUNTER — Other Ambulatory Visit: Payer: Self-pay

## 2023-08-09 ENCOUNTER — Ambulatory Visit (INDEPENDENT_AMBULATORY_CARE_PROVIDER_SITE_OTHER): Payer: Self-pay

## 2023-08-09 DIAGNOSIS — J3089 Other allergic rhinitis: Secondary | ICD-10-CM

## 2023-08-09 DIAGNOSIS — J301 Allergic rhinitis due to pollen: Secondary | ICD-10-CM

## 2023-08-09 DIAGNOSIS — Z91038 Other insect allergy status: Secondary | ICD-10-CM

## 2023-08-09 NOTE — Nursing Note (Signed)
08/09/23 1400 08/09/23 1414   Mixed Vials   Allergy Vial #2 Date Mixed  --  08/09/23   Allergy Vial # 2 Identification Number  --  [id number]   Test Wheal Vial #2  --  7 mm wheal   Comments NAV  --    Allergy Injection Flowsheet   Date of Injection 08/09/23 08/09/23   Allergy Vial Identification Number [id number] [phone]   Any Reaction to Previous Administration? No No   Any Illness/ Fever in Last 24 hours?* No No   Any Change in Medication? No No   Is the patient on a beta blocker? No No   Does the patient have a valid EpiPen at visit? Yes Yes   Does patient have a history of Asthma? No No   Expiration date 10/30/23 10/30/23   Dose 0.29ml 0.50ml   Maintenance Dose 0.32ml 0.58ml   Site Left arm Right arm   Patient tolerated procedure well yes yes   Allergy Diagnosis J30.1 - allergic rhinitis due to pollen J30.89 - allergic rhinitis due to dust mite;J30.89 - allergic rhinitis due to mold   Time given 1427 1427   Time read 1447 1447   Patient declined to wait 20 minutes No No   Managing Physician Ramadan Ramadan   Initials ts ts   Were the vials mailed? No No         I have reviewed the above allergy vial safety test, give injection.  Pincus Sanes, APRN,NP-C

## 2023-08-16 ENCOUNTER — Ambulatory Visit (INDEPENDENT_AMBULATORY_CARE_PROVIDER_SITE_OTHER): Payer: Self-pay

## 2023-08-17 ENCOUNTER — Other Ambulatory Visit: Payer: Self-pay

## 2023-08-17 ENCOUNTER — Ambulatory Visit (INDEPENDENT_AMBULATORY_CARE_PROVIDER_SITE_OTHER): Payer: BC Managed Care – PPO

## 2023-08-17 DIAGNOSIS — Z91038 Other insect allergy status: Secondary | ICD-10-CM

## 2023-08-17 DIAGNOSIS — J301 Allergic rhinitis due to pollen: Secondary | ICD-10-CM

## 2023-08-17 DIAGNOSIS — J3089 Other allergic rhinitis: Secondary | ICD-10-CM

## 2023-08-17 NOTE — Nursing Note (Signed)
08/17/23 1500 08/17/23 1515   Allergy Injection Flowsheet   Date of Injection 08/17/23 08/17/23   Allergy Vial Identification Number [id number] [phone]   Any Reaction to Previous Administration? No No   Any Illness/ Fever in Last 24 hours?* No No   Any Change in Medication? No No   Is the patient on a beta blocker? No No   Does the patient have a valid EpiPen at visit? Yes Yes   Does patient have a history of Asthma? No No   Expiration date 10/30/23 10/30/23   Dose 0.56ml 0.42ml   Maintenance Dose 0.12ml 0.42ml   Site Left arm Right arm   Patient tolerated procedure well yes yes   Allergy Diagnosis J30.1 - allergic rhinitis due to pollen J30.89 - allergic rhinitis due to dust mite;J30.89 - allergic rhinitis due to mold   Time given 1518 1518   Time read 1538 1538   Patient declined to wait 20 minutes No No   Managing Physician Ramadan Ramadan   Initials jh jh   Were the vials mailed? No No

## 2023-08-23 ENCOUNTER — Other Ambulatory Visit: Payer: Self-pay

## 2023-08-23 ENCOUNTER — Ambulatory Visit (INDEPENDENT_AMBULATORY_CARE_PROVIDER_SITE_OTHER): Payer: BC Managed Care – PPO

## 2023-08-23 DIAGNOSIS — Z91038 Other insect allergy status: Secondary | ICD-10-CM

## 2023-08-23 DIAGNOSIS — J301 Allergic rhinitis due to pollen: Secondary | ICD-10-CM

## 2023-08-23 DIAGNOSIS — J3089 Other allergic rhinitis: Secondary | ICD-10-CM

## 2023-08-23 NOTE — Nursing Note (Signed)
08/23/23 1400 08/23/23 1410   Allergy Injection Flowsheet   Date of Injection 08/23/23 08/23/23   Allergy Vial Identification Number [id number] [phone]   Any Reaction to Previous Administration? No No   Any Illness/ Fever in Last 24 hours?* No No   Any Change in Medication? No No   Is the patient on a beta blocker? No No   Does the patient have a valid EpiPen at visit? Yes Yes   Does patient have a history of Asthma? 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 No No

## 2023-08-30 ENCOUNTER — Ambulatory Visit (INDEPENDENT_AMBULATORY_CARE_PROVIDER_SITE_OTHER): Payer: BC Managed Care – PPO

## 2023-08-30 ENCOUNTER — Other Ambulatory Visit: Payer: Self-pay

## 2023-08-30 DIAGNOSIS — Z91038 Other insect allergy status: Secondary | ICD-10-CM

## 2023-08-30 DIAGNOSIS — J301 Allergic rhinitis due to pollen: Secondary | ICD-10-CM

## 2023-08-30 DIAGNOSIS — J3089 Other allergic rhinitis: Secondary | ICD-10-CM

## 2023-08-30 NOTE — Nursing Note (Signed)
08/30/23 1400 08/30/23 1421   Allergy Injection Flowsheet   Date of Injection 08/30/23 08/30/23   Allergy Vial Identification Number [id number] [phone]   Any Reaction to Previous Administration? No No   Any Illness/ Fever in Last 24 hours?* No No   Any Change in Medication? No No   Is the patient on a beta blocker? No No   Does the patient have a valid EpiPen at visit? Yes Yes   Does patient have a history of Asthma? No No   Expiration date 10/30/23 10/30/23   Dose 0.74ml 0.27ml   Maintenance Dose 0.78ml 0.69ml   Site Left arm Right arm   Patient tolerated procedure well yes yes   Allergy Diagnosis J30.1 - allergic rhinitis due to pollen J30.89 - allergic rhinitis due to dust mite;J30.89 - allergic rhinitis due to mold   Time given 1423 1423   Time read 1443 1443   Patient declined to wait 20 minutes No No   Managing Physician Ramadan Ramadan   Initials dh dh   Were the vials mailed? No No   VIAL   Comments  --  10/2023     Jackelyn Knife, LPN

## 2023-09-06 ENCOUNTER — Other Ambulatory Visit: Payer: Self-pay

## 2023-09-06 ENCOUNTER — Ambulatory Visit (INDEPENDENT_AMBULATORY_CARE_PROVIDER_SITE_OTHER): Payer: BC Managed Care – PPO

## 2023-09-06 DIAGNOSIS — J3089 Other allergic rhinitis: Secondary | ICD-10-CM

## 2023-09-06 DIAGNOSIS — Z91038 Other insect allergy status: Secondary | ICD-10-CM

## 2023-09-06 DIAGNOSIS — J301 Allergic rhinitis due to pollen: Secondary | ICD-10-CM

## 2023-09-06 NOTE — Nursing Note (Signed)
09/06/23 1400 09/06/23 1431   Allergy Injection Flowsheet   Date of Injection 09/06/23 09/06/23   Allergy Vial Identification Number [id number] [phone]   Any Reaction to Previous Administration? No No   Any Illness/ Fever in Last 24 hours?* No No   Any Change in Medication? No No   Is the patient on a beta blocker? No No   Does the patient have a valid EpiPen at visit? Yes Yes   Does patient have a history of Asthma? No No   Expiration date 10/30/23 10/30/23   Dose 0.23ml 0.43ml   Maintenance Dose 0.22ml 0.57ml   Site Left arm Right arm   Patient tolerated procedure well yes yes   Allergy Diagnosis J30.1 - allergic rhinitis due to pollen J30.89 - allergic rhinitis due to dust mite;J30.89 - allergic rhinitis due to mold   Time given 1433 1433   Time read 1453 1453   Patient declined to wait 20 minutes No No   Managing Physician Ramadan Ramadan   Initials dh dh   Were the vials mailed? No No   VIAL   Comments  --  10/2023     Jackelyn Knife, LPN

## 2023-09-07 ENCOUNTER — Ambulatory Visit (INDEPENDENT_AMBULATORY_CARE_PROVIDER_SITE_OTHER): Payer: Self-pay | Admitting: Otolaryngology

## 2023-09-13 ENCOUNTER — Ambulatory Visit (INDEPENDENT_AMBULATORY_CARE_PROVIDER_SITE_OTHER): Payer: Self-pay

## 2023-09-20 ENCOUNTER — Other Ambulatory Visit: Payer: Self-pay

## 2023-09-20 ENCOUNTER — Ambulatory Visit (INDEPENDENT_AMBULATORY_CARE_PROVIDER_SITE_OTHER): Payer: BC Managed Care – PPO

## 2023-09-20 DIAGNOSIS — J301 Allergic rhinitis due to pollen: Secondary | ICD-10-CM

## 2023-09-20 DIAGNOSIS — Z91038 Other insect allergy status: Secondary | ICD-10-CM

## 2023-09-20 DIAGNOSIS — J3089 Other allergic rhinitis: Secondary | ICD-10-CM

## 2023-09-20 NOTE — Nursing Note (Signed)
09/20/23 1100   Mixed Vials   Comments No injections give today per Dr. Fredric Mare. Patient having multiple hive break outs on various locations on the body multiple times per day, for the last two weeks. Pt states that they have been decreasing over the last 3 days. Nicki Guadalajara to consult with Dr. Ramadan regarding updated dosing/instructions.   Sublingual Immunotherapy   Comments DO NOT GIVE INJECTIONS UNTIL UPDATED INSTRUCTIONS ARE IN CHART   Allergy Injection Flowsheet   Date of Injection 09/20/23   Initials dh     Jackelyn Knife, LPN

## 2023-09-25 ENCOUNTER — Other Ambulatory Visit (INDEPENDENT_AMBULATORY_CARE_PROVIDER_SITE_OTHER): Payer: Self-pay | Admitting: Otolaryngology

## 2023-09-27 ENCOUNTER — Other Ambulatory Visit: Payer: Self-pay

## 2023-09-27 ENCOUNTER — Ambulatory Visit (INDEPENDENT_AMBULATORY_CARE_PROVIDER_SITE_OTHER): Payer: BC Managed Care – PPO

## 2023-09-27 DIAGNOSIS — J301 Allergic rhinitis due to pollen: Secondary | ICD-10-CM

## 2023-09-27 DIAGNOSIS — Z91038 Other insect allergy status: Secondary | ICD-10-CM

## 2023-09-27 DIAGNOSIS — J3089 Other allergic rhinitis: Secondary | ICD-10-CM

## 2023-09-27 NOTE — Nursing Note (Signed)
09/27/23 1000 09/27/23 1030   Allergy Injection Flowsheet   Date of Injection 09/27/23 09/27/23   Allergy Vial Identification Number [id number] [phone]   Any Reaction to Previous Administration? No No   Any Illness/ Fever in Last 24 hours?* No No   Any Change in Medication? No No   Is the patient on a beta blocker? No No   Does the patient have a valid EpiPen at visit? Yes Yes   Does patient have a history of Asthma? No No   Expiration date 10/30/23 10/30/23   Dose 0.29ml 0.27ml   Maintenance Dose 0.75ml 0.76ml   Site Left arm Right arm   Patient tolerated procedure well yes yes   Allergy Diagnosis J30.1 - allergic rhinitis due to pollen J30.89 - allergic rhinitis due to dust mite;J30.89 - allergic rhinitis due to mold   Time given 1029 1029   Time read 1050 1050   Patient declined to wait 20 minutes No No   Managing Physician Ramadan Ramadan   Initials cs cs   Were the vials mailed? No No   VIAL   Comments [id number]  --

## 2023-10-04 ENCOUNTER — Other Ambulatory Visit: Payer: Self-pay

## 2023-10-04 ENCOUNTER — Ambulatory Visit (INDEPENDENT_AMBULATORY_CARE_PROVIDER_SITE_OTHER): Payer: Self-pay

## 2023-10-04 DIAGNOSIS — J301 Allergic rhinitis due to pollen: Secondary | ICD-10-CM

## 2023-10-04 DIAGNOSIS — J3089 Other allergic rhinitis: Secondary | ICD-10-CM

## 2023-10-04 DIAGNOSIS — Z91038 Other insect allergy status: Secondary | ICD-10-CM

## 2023-10-04 NOTE — Nursing Note (Signed)
10/04/23 1000 10/04/23 1039   Allergy Injection Flowsheet   Date of Injection 10/04/23 10/04/23   Allergy Vial Identification Number [id number] [phone]   Any Reaction to Previous Administration? No No   Any Illness/ Fever in Last 24 hours?* No No   Any Change in Medication? No No   Is the patient on a beta blocker? No No   Does the patient have a valid EpiPen at visit? Yes Yes   Does patient have a history of Asthma? No No   Expiration date 10/30/23 10/30/23   Dose 0.67ml 0.47ml   Maintenance Dose 0.69ml 0.70ml   Site Left arm Right arm   Patient tolerated procedure well yes yes   Allergy Diagnosis J30.1 - allergic rhinitis due to pollen J30.89 - allergic rhinitis due to dust mite;J30.89 - allergic rhinitis due to mold   Time given 1038 1038   Time read 1058 1058   Patient declined to wait 20 minutes No No   Managing Physician Ramadan Ramadan   Initials cs cs   Were the vials mailed? No No   VIAL   Comments [id number]  --

## 2023-10-11 ENCOUNTER — Other Ambulatory Visit: Payer: Self-pay

## 2023-10-11 ENCOUNTER — Ambulatory Visit (INDEPENDENT_AMBULATORY_CARE_PROVIDER_SITE_OTHER): Payer: BC Managed Care – PPO

## 2023-10-11 DIAGNOSIS — Z91038 Other insect allergy status: Secondary | ICD-10-CM

## 2023-10-11 DIAGNOSIS — J301 Allergic rhinitis due to pollen: Secondary | ICD-10-CM

## 2023-10-11 DIAGNOSIS — J3089 Other allergic rhinitis: Secondary | ICD-10-CM

## 2023-10-11 NOTE — Nursing Note (Signed)
10/11/23 1000 10/11/23 1028   Mixed Vials   Comments  --  ok per Leanne for pt to take vials the week of Thanksgiving to get injections at home, will need to bring back and provide documentation   Allergy Injection Flowsheet   Date of Injection 10/11/23 10/11/23   Allergy Vial Identification Number [id number] [phone]   Any Reaction to Previous Administration? No No   Any Illness/ Fever in Last 24 hours?* No No   Any Change in Medication? No No   Is the patient on a beta blocker? No No   Does the patient have a valid EpiPen at visit? Yes Yes   Does patient have a history of Asthma? No No   Expiration date 10/30/23 10/30/23   Dose 0.29ml 0.6ml   Maintenance Dose 0.43ml 0.27ml   Site Left arm Right arm   Patient tolerated procedure well yes yes   Allergy Diagnosis J30.1 - allergic rhinitis due to pollen J30.89 - allergic rhinitis due to dust mite;J30.89 - allergic rhinitis due to mold   Time given 1028 1028   Time read 1048 1048   Patient declined to wait 20 minutes No No   Managing Physician Ramadan Ramadan   Initials cs cs   Were the vials mailed? No No   VIAL   Comments [id number]  --

## 2023-10-18 ENCOUNTER — Ambulatory Visit (INDEPENDENT_AMBULATORY_CARE_PROVIDER_SITE_OTHER): Payer: BC Managed Care – PPO

## 2023-10-18 ENCOUNTER — Other Ambulatory Visit: Payer: Self-pay

## 2023-10-18 DIAGNOSIS — J3089 Other allergic rhinitis: Secondary | ICD-10-CM

## 2023-10-18 DIAGNOSIS — J301 Allergic rhinitis due to pollen: Secondary | ICD-10-CM

## 2023-10-18 DIAGNOSIS — Z91038 Other insect allergy status: Secondary | ICD-10-CM

## 2023-10-18 NOTE — Nursing Note (Signed)
10/18/23 1000 10/18/23 1100   Mixed Vials   Comments Patient took vials and instructions for holiday week  --    Allergy Injection Flowsheet   Date of Injection 10/18/23 10/18/23   Allergy Vial Identification Number [id number] [phone]   Any Reaction to Previous Administration? No No   Any Illness/ Fever in Last 24 hours?* No No   Any Change in Medication? No No   Is the patient on a beta blocker? No No   Does the patient have a valid EpiPen at visit? Yes Yes   Does patient have a history of Asthma? No No   Expiration date 10/30/23 10/30/23   Dose 0.6ml 0.14ml   Maintenance Dose 0.81ml 0.34ml   Site Left arm Right arm   Patient tolerated procedure well yes yes   Allergy Diagnosis J30.1 - allergic rhinitis due to pollen J30.89 - allergic rhinitis due to dust mite;J30.89 - allergic rhinitis due to mold   Time given 1100 1100   Time read 1120 1120   Patient declined to wait 20 minutes No No   Managing Physician Ramadan Ramadan   Initials ts ts   Were the vials mailed? No No

## 2023-10-19 ENCOUNTER — Other Ambulatory Visit (INDEPENDENT_AMBULATORY_CARE_PROVIDER_SITE_OTHER): Payer: Self-pay | Admitting: Otolaryngology

## 2023-10-19 DIAGNOSIS — J301 Allergic rhinitis due to pollen: Secondary | ICD-10-CM

## 2023-10-19 DIAGNOSIS — J3089 Other allergic rhinitis: Secondary | ICD-10-CM

## 2023-10-19 DIAGNOSIS — Z91038 Other insect allergy status: Secondary | ICD-10-CM

## 2023-10-23 ENCOUNTER — Telehealth (INDEPENDENT_AMBULATORY_CARE_PROVIDER_SITE_OTHER): Payer: Self-pay | Admitting: Otolaryngology

## 2023-10-23 DIAGNOSIS — J309 Allergic rhinitis, unspecified: Secondary | ICD-10-CM

## 2023-10-23 NOTE — Telephone Encounter (Addendum)
Tyler Navarro, Thom Chimes, MD  Martese Vanatta, Latina Craver, CLINICAL CARE COORDINATOR  Sure          Previous Messages       ----- Message -----  From: Noemi Chapel, CLINICAL CARE COORDINATOR  Sent: 10/18/2023  11:07 AM EST  To: Thom Chimes Ramadan, MD  Subject: Dermatology Referral                            Hi Dr. Ramadan,    This patient is still experiencing the hives that we cannot explain. He's interested in seeing dermatology. Can I put that referral in?    Thanks,  Trish      Attempted to call patient to advise of above. No response. Left message and call back number.

## 2023-10-24 ENCOUNTER — Ambulatory Visit (INDEPENDENT_AMBULATORY_CARE_PROVIDER_SITE_OTHER): Payer: BC Managed Care – PPO | Admitting: *Deleted

## 2023-10-24 DIAGNOSIS — J309 Allergic rhinitis, unspecified: Secondary | ICD-10-CM

## 2023-10-30 ENCOUNTER — Other Ambulatory Visit (INDEPENDENT_AMBULATORY_CARE_PROVIDER_SITE_OTHER): Payer: BC Managed Care – PPO | Admitting: Otolaryngology

## 2023-10-30 DIAGNOSIS — J3089 Other allergic rhinitis: Secondary | ICD-10-CM

## 2023-10-30 DIAGNOSIS — J301 Allergic rhinitis due to pollen: Secondary | ICD-10-CM

## 2023-10-31 NOTE — Nursing Note (Signed)
10/30/23 1149   ENT Immunotherapy Vial Preparation Flowsheet   Allergy Test Date 02/16/22   Immunotherapy Start Date 03/16/22   Type of Immunotherapy? Subcutaneous   Managing Physician Jerett Odonohue   Compounding Staff Waymond Cera   Initials CS   Vial #1   [place] #1 Identification Number [id number]   Preparation Date 10/30/23   Expiration Date 01/28/24   Immunotherapy Status No escalation   Diagnosis Allergic Rhinitis due to pollen (J30.1)   Ragweed, Short 1:20 W/V   Ragweed, Short Initial Endpoint 5   Dilution (10% Glycerin) Used 4   Volume of Dilution Used 0.60ml   Lot Number [id number]   Expiration Date 02/20/24   Manufacturer ALK   Pigweed, Rough/Redroot 1:20 W/V   Pigweed, Rough/Redroot Initial Endpoint 5   Dilution (10% Glycerin) Used 4   Volume of Dilution Used 0.54ml   Lot Number [id number]   Expiration Date 02/20/24   Manufacturer ALK   Plantain, English 1:20 W/V   Plantain, English Initial Endpoint 5   Dilution (10% Glycerin) Used 4   Volume of Dilution Used 0.44ml   Lot Number [id number]   Expiration Date 02/20/24   Manufacturer ALK   Lamb's Quarters 1:20 W/V   Lamb's Quarters Initial Endpoint 4   Dilution (10% Glycerin) Used 3   Volume of Dilution Used 0.36ml   Lot Number [id number]   Expiration Date 02/20/24   Manufacturer ALK   Johnson Grass 1:20 W/V   Johnson Grass Initial Endpoint 5   Dilution (10% Glycerin) Used 4   Volume of Dilution Used 0.72ml   Lot Number [id number]   Expiration Date 02/20/24   Manufacturer ALK   Red Oak 1:20 W/V   Red Oak Initial Endpoint 5   Dilution (10% Glycerin) Used 4    Volume of Dilution Used 0.57ml   Lot Number [id number]   Expiration Date 02/20/24   Manufacturer ALK   [place] 1:20 W/V   [place] Initial Endpoint 5   Dilution (10% Glycerin) Used 4    Volume of Dilution Used 0.68ml   Lot Number [id number]   Expiration Date 02/20/24   Manufacturer ALK   Charletta Cousin, Red 1:20 W/V   Charletta Cousin, Red Initial Endpoint 5   Dilution (10% Glycerin) Used 4    Volume of Dilution Used 0.23ml   Lot  Number [id number]   Expiration Date 02/20/24   Manufacturer ALK   [place]camore, American Eastern 1:20 W/V   [place], American [place] Initial Endpoint 5   Dilution (10% Glycerin) Used 4   Volume of Dilution Used 0.25ml   Lot Number [id number]   Expiration Date 02/20/24   Manufacturer ALK   Other Vial #1   Antigen Volume 1.8   Sterile Diluent Normal Saline Volume 3.2   Total Volume 5ml   Sterile Diluent Normal Saline Lot Number [id number]   Sterile Diluent Normal Saline Expiration Date 12/27/25   Sterile Diluent Normal Saline Manufacturer ALK   Ceasar Mons #2 Identification Number [id number]   Preparation Date 10/30/23   Expiration Date 01/28/24   Immunotherapy Status No escalation   Diagnosis Allergic Rhinitis due to dustmite (J30.89);Allergic Rhinitis due to mold (J30.89)   Dustmite Farinae 10,000 AU/ml   Dustmite Farinae Initial Endpoint 2    Dilution (10% Glycerin) Used 1   Volume of Dilution Used 0.92ml   Lot Number [id number]   Expiration Date 02/20/24   Manufacturer ALK   Dustmite Pteronyssinus 10,000 AU/ml   Dustmite Pteronyssinus Initial Endpoint 4  Dilution (10% Glycerin) Used 3   Volume of Dilution Used 0.105ml   Lot Number [id number]   Expiration Date 02/20/24   Manufacturer ALK   Alternaria 1:40 W/V   Alternaria Initial Endpoint 4    Dilution (10% Glycerin) Used 3   Volume of Dilution Used 0.52ml   Lot Number [id number]   Expiration Date 02/20/24   Manufacturer ALK   Bipoloris Sorokiniana 1:40 W/V   Bipoloris Sorokiniana Initial Endpoint 3   Dilution (10% Glycerin) Used 2   Volume of Dilution Used 0.1ml   Lot Number [id number]   Expiration Date 02/20/24   Manufacturer ALK   Other Vial #2   Antigen Volume 0.8   Sterile Diluent Normal Saline Volume 4.2   Total Volume 5ml   Sterile Diluent Normal Saline Lot Number [id number]   Sterile Diluent Normal Saline Expiration Date 12/27/25   Sterile Diluent Normal Saline Manufacturer ALK   2 (10 unit) vials mixed.

## 2023-11-01 ENCOUNTER — Ambulatory Visit (INDEPENDENT_AMBULATORY_CARE_PROVIDER_SITE_OTHER): Payer: BC Managed Care – PPO

## 2023-11-01 DIAGNOSIS — J3089 Other allergic rhinitis: Secondary | ICD-10-CM

## 2023-11-01 DIAGNOSIS — J301 Allergic rhinitis due to pollen: Secondary | ICD-10-CM

## 2023-11-01 DIAGNOSIS — Z91038 Other insect allergy status: Secondary | ICD-10-CM

## 2023-11-01 NOTE — Nursing Note (Signed)
11/01/23 1100 11/01/23 1112   Mixed Vials   Comments NAV  --    Allergy Injection Flowsheet   Date of Injection 11/01/23 11/01/23   Allergy Vial Identification Number [id number]   Any Reaction to Previous Administration? No No   Any Illness/ Fever in Last 24 hours?* No No   Any Change in Medication? No No   Is the patient on a beta blocker? No No   Does the patient have a valid EpiPen at visit? Yes Yes   Does patient have a history of Asthma? No No   Expiration date 01/28/24 01/28/24   Dose 0.106ml 0.82ml   Maintenance Dose 0.19ml 0.56ml   Site Left arm Right arm   Patient tolerated procedure well yes yes   Allergy Diagnosis J30.1 - allergic rhinitis due to pollen J30.89 - allergic rhinitis due to dust mite;J30.89 - allergic rhinitis due to mold   Time given 1112 1112   Time read 1132 1132   Patient declined to wait 20 minutes No No   Managing Physician Ramadan Ramadan   Initials ts ts   Were the vials mailed? No No

## 2023-11-08 ENCOUNTER — Ambulatory Visit (INDEPENDENT_AMBULATORY_CARE_PROVIDER_SITE_OTHER): Payer: Self-pay

## 2023-11-13 ENCOUNTER — Ambulatory Visit (INDEPENDENT_AMBULATORY_CARE_PROVIDER_SITE_OTHER): Payer: BC Managed Care – PPO | Admitting: Otolaryngology

## 2023-11-13 ENCOUNTER — Other Ambulatory Visit: Payer: Self-pay

## 2023-11-13 ENCOUNTER — Encounter (INDEPENDENT_AMBULATORY_CARE_PROVIDER_SITE_OTHER): Payer: Self-pay | Admitting: Otolaryngology

## 2023-11-13 VITALS — BP 122/64 | HR 69 | Temp 97.6°F | Ht 72.0 in | Wt 188.5 lb

## 2023-11-13 DIAGNOSIS — J309 Allergic rhinitis, unspecified: Secondary | ICD-10-CM

## 2023-11-13 NOTE — Progress Notes (Signed)
ENT, [facility]  [address] [place]  [place] [address]  [phone]    PATIENT NAME:  Tyler Navarro  MRN:  [id number]  DOB:  2001-10-18  DATE OF SERVICE: 11/13/2023    Chief Complaint:  Allergic Rhinitis      HPI:  Tyler Navarro is a 22 y.o. male we follow for allergic rhinitis. He is presenting in here today for that. He started on them back in April of 2023. He does have them really severely so we have been gingerly trying to escalate him. We were able to do that here in the last couple times he was mixed. Here of late though, it seems he has been having issues with some skin rashes, which seems to be all over the place. It does not seem to be related to the shot. It can happen the same day, day after, or 5 to 6 days later. No breathing difficulties with the shots. He does feel as if he is getting a little bit better with the allergy shots.     Medications:  Outpatient Medications Marked as Taking for the 11/13/23 encounter (Office Visit) with Tyaira Heward, Thom Chimes, MD   Medication Sig   . cetirizine (ZYRTEC) 10 mg Oral Tablet Take 1 Tablet (10 mg total) by mouth Once a day   . fexofenadine (ALLEGRA) 180 mg Oral Tablet Take 1 Tablet (180 mg total) by mouth Once a day   . montelukast (SINGULAIR) 10 mg Oral Tablet Take 1 Tablet (10 mg total) by mouth Every evening       Allergies:  Allergies   Allergen Reactions   . Grass Pollen    . House Dust    . Tree And Shrub Pollen          Physical Exam:  Blood pressure 122/64, pulse 69, temperature 36.4 C (97.6 F), temperature source Temporal, height 1.829 m (6'), weight 85.5 kg (188 lb 7.9 oz).  Body mass index is 25.56 kg/m.  General Appearance: Pleasant, cooperative, healthy, and in no acute distress.  Eyes: Conjunctivae/corneas clear.  Head and Face: Face symmetric, no obvious lesions.   External auditory canals:  Patent without inflammation.  Tympanic membranes:  Intact, translucent, midposition, middle ear aerated.  Nose: Turbinate  hypertrophy.   Oral Cavity/Oropharynx: No mucosal lesions, masses, or pharyngeal asymmetry.  Skin: No rashes on the skin today, but he did show me pictures.     Procedure:             Data Reviewed:    Allergy testing reviewed.     Assessment:      ICD-10-CM    1. Allergic rhinitis  J30.9            Plan:  We will keep him where he is at. He has a Dermatology consult in January. We will await that consult and then we will proceed accordingly.     Thom Chimes Myesha Stillion, MD     kll

## 2023-11-15 ENCOUNTER — Ambulatory Visit (INDEPENDENT_AMBULATORY_CARE_PROVIDER_SITE_OTHER): Payer: BC Managed Care – PPO

## 2023-11-15 ENCOUNTER — Other Ambulatory Visit (INDEPENDENT_AMBULATORY_CARE_PROVIDER_SITE_OTHER): Payer: Self-pay | Admitting: Otolaryngology

## 2023-11-15 ENCOUNTER — Other Ambulatory Visit: Payer: Self-pay

## 2023-11-15 DIAGNOSIS — J301 Allergic rhinitis due to pollen: Secondary | ICD-10-CM

## 2023-11-15 DIAGNOSIS — J3089 Other allergic rhinitis: Secondary | ICD-10-CM

## 2023-11-15 DIAGNOSIS — Z91038 Other insect allergy status: Secondary | ICD-10-CM

## 2023-11-15 MED ORDER — EPINEPHRINE 0.3 MG/0.3 ML INJECTION, AUTO-INJECTOR
0.3000 mg | AUTO-INJECTOR | Freq: Once | INTRAMUSCULAR | 0 refills | Status: DC | PRN
Start: 2023-11-15 — End: 2023-12-14

## 2023-11-15 NOTE — Nursing Note (Signed)
11/15/23 1300 11/15/23 1318   Allergy Injection Flowsheet   Date of Injection 11/15/23 11/15/23   Allergy Vial Identification Number [id number]   Any Reaction to Previous Administration? No No   Any Illness/ Fever in Last 24 hours?* No No   Any Change in Medication? No No   Is the patient on a beta blocker? No No   Does the patient have a valid EpiPen at visit? Yes Yes   Does patient have a history of Asthma? No No   Expiration date 01/28/24 01/28/24   Dose 0.91ml 0.37ml   Maintenance Dose 0.23ml 0.39ml   Site Left arm Right arm   Patient tolerated procedure well yes yes   Allergy Diagnosis J30.1 - allergic rhinitis due to pollen J30.89 - allergic rhinitis due to dust mite;J30.89 - allergic rhinitis due to mold   Time given 1318 1318   Time read 1338 1338   Patient declined to wait 20 minutes No No   Managing Physician Ramadan Ramadan   Initials cs cs   Were the vials mailed? No No   VIAL   Comments [id number]--new Rx for Epipen pt took vials and instructions

## 2023-11-15 NOTE — Telephone Encounter (Signed)
Pt requested refill on Epipen to be sent to [facility].  Last seen 11/13/23, next appointment not scheduled yet

## 2023-11-29 ENCOUNTER — Ambulatory Visit (INDEPENDENT_AMBULATORY_CARE_PROVIDER_SITE_OTHER): Payer: BC Managed Care – PPO | Admitting: *Deleted

## 2023-11-29 ENCOUNTER — Ambulatory Visit (INDEPENDENT_AMBULATORY_CARE_PROVIDER_SITE_OTHER): Payer: BC Managed Care – PPO | Admitting: Family Medicine

## 2023-11-29 ENCOUNTER — Encounter: Payer: Self-pay | Admitting: Family Medicine

## 2023-11-29 ENCOUNTER — Other Ambulatory Visit: Payer: Self-pay

## 2023-11-29 VITALS — BP 112/60 | HR 55 | Temp 98.4°F | Resp 16 | Ht 71.46 in | Wt 192.8 lb

## 2023-11-29 DIAGNOSIS — J309 Allergic rhinitis, unspecified: Secondary | ICD-10-CM

## 2023-11-29 DIAGNOSIS — J4520 Mild intermittent asthma, uncomplicated: Secondary | ICD-10-CM | POA: Diagnosis not present

## 2023-11-29 DIAGNOSIS — J3089 Other allergic rhinitis: Secondary | ICD-10-CM

## 2023-11-29 DIAGNOSIS — J302 Other seasonal allergic rhinitis: Secondary | ICD-10-CM | POA: Diagnosis not present

## 2023-11-29 DIAGNOSIS — R21 Rash and other nonspecific skin eruption: Secondary | ICD-10-CM

## 2023-11-29 NOTE — Patient Instructions (Addendum)
 Asthma  Continue montelukast 10 mg once a day to prevent cough or wheeze  Continue albuterol 2 puffs once every 4 hours if needed for cough or wheeze  You may use albuterol 2 puffs 5 to 15 minutes before activity to decrease cough or wheeze    Allergic rhinitis  Continue allergen avoidance measures directed toward pollen, dust mite, and mold as listed below  Continue allergen immunotherapy and have access to an epinephrine auto-injector set per protocol  Continue cetirizine 10 mg once a day as needed for runny nose or itch  Continue Flonase 2 sprays in each nostril once a day if needed for stuffy nose  Continue allergen immunotherapy and have access to an epinephrine autoinjector set per protocol    Rash  Continue a twice a day moisturizing routine   Continue triamcinolone to red or itchy areas under your face as needed. Do not use this medication longer than 2 weeks in a row.   Keep your appointment with your dermatology specialist on December 18, 2023    Call the clinic if this treatment plan is not working well for you.    Follow up in 1 year or sooner if needed.    Reducing Pollen Exposure  The American Academy of Allergy, Asthma and Immunology suggests the following steps to reduce your exposure to pollen during allergy seasons.  Do not hang sheets or clothing out to dry; pollen may collect on these items.  Do not mow lawns or spend time around freshly cut grass; mowing stirs up pollen.  Keep windows closed at night.  Keep car windows closed while driving.  Minimize morning activities outdoors, a time when pollen counts are usually at their highest.  Stay indoors as much as possible when pollen counts or humidity is high and on windy days when pollen tends to remain in the air longer.  Use air conditioning when possible.  Many air conditioners have filters that trap the pollen spores.  Use a HEPA room air filter to remove pollen form the indoor air you breathe.    Control of Mold Allergen  Mold and fungi can  grow on a variety of surfaces provided certain temperature and moisture conditions exist.  Outdoor molds grow on plants, decaying vegetation and soil.  The major outdoor mold, Alternaria and Cladosporium, are found in very high numbers during hot and dry conditions.  Generally, a late Summer - Fall peak is seen for common outdoor fungal spores.  Rain will temporarily lower outdoor mold spore count, but counts rise rapidly when the rainy period ends.  The most important indoor molds are Aspergillus and Penicillium.  Dark, humid and poorly ventilated basements are ideal sites for mold growth.  The next most common sites of mold growth are the bathroom and the kitchen.    Outdoor [employer]  Use air conditioning and keep windows closed  Avoid exposure to decaying vegetation.  Avoid leaf raking.  Avoid grain handling.  Consider wearing a face mask if working in moldy areas.    Indoor Mold Control  Maintain humidity below 50%.  Clean washable surfaces with 5% bleach solution.  Remove sources e.g. Contaminated carpets.      Control of Dust Mite Allergen  Dust mites play a major role in allergic asthma and rhinitis. They occur in environments with high humidity wherever human skin is found. Dust mites absorb humidity from the atmosphere (ie, they do not drink) and feed on organic matter (including shed human and animal skin).  Dust mites are a microscopic type of insect that you cannot see with the naked eye. High levels of dust mites have been detected from mattresses, pillows, carpets, upholstered furniture, bed covers, clothes, soft toys and any woven material. The principal allergen of the dust mite is found in its feces. A gram of dust may contain 1,000 mites and 250,000 fecal particles. Mite antigen is easily measured in the air during house cleaning activities. Dust mites do not bite and do not cause harm to humans, other than by triggering allergies/asthma.    Ways to decrease your exposure to dust mites in your  home:    1. Encase mattresses, box springs and pillows with a mite-impermeable barrier or cover    2. Wash sheets, blankets and drapes weekly in hot water (130 F) with detergent and dry them in a dryer on the hot setting.    3. Have the room cleaned frequently with a vacuum cleaner and a damp dust-mop. For carpeting or rugs, vacuuming with a vacuum cleaner equipped with a high-efficiency particulate air (HEPA) filter. The dust mite allergic individual should not be in a room which is being cleaned and should wait 1 hour after cleaning before going into the room.    4. Do not sleep on upholstered furniture (eg, couches).    5. If possible removing carpeting, upholstered furniture and drapery from the home is ideal. Horizontal blinds should be eliminated in the rooms where the person spends the most time (bedroom, study, television room). Washable vinyl, roller-type shades are optimal.    6. Remove all non-washable stuffed toys from the bedroom. Wash stuffed toys weekly like sheets and blankets above.    7. Reduce indoor humidity to less than 50%. Inexpensive humidity monitors can be purchased at most hardware stores. Do not use a humidifier as can make the problem worse and are not recommended.

## 2023-11-29 NOTE — Progress Notes (Signed)
 [address] [place] [address] Dept: [phone]  FOLLOW UP NOTE  Patient ID: Tracy [name], male    DOB: 16-Sep-2001  Age: 23 y.o. MRN: [id number] Date of Office Visit: 11/29/2023  Assessment  Chief Complaint: Follow-up  HPI Tracy [name] is a 23 year old male who presents in clinic for follow-up visit.  He was last seen in this clinic on 11/24/2021 by Dr. Luke for evaluation of rash, asthma, and allergic rhinitis.    At today's visit, he reports his asthma has been well-controlled with no shortness of breath, cough, or wheeze with activity or rest.  He continues montelukast  about 4 days a week and has not used albuterol  over the last several years.  Allergic rhinitis is reported as moderately well-controlled with occasional nasal congestion and sneezing as the main symptoms.  He continues cetirizine  10 mg once a day and is not currently using nasal saline rinses or steroid nasal spray.  He continues to receive allergen immunotherapy with his ENT provider, Dr. Norval David, in [place], [place] where he attends college.   He reports that he develops a red and occasionally itchy rash that occurs on any part of his body and can occur at any time. He denies concomitant cardiopulmonary or gastrointestinal symptoms with this rash.  He reports Benadryl relieves the symptoms of redness and itch.  He has an initial appointment for evaluation of this rash with a dermatology specialist on December 18, 2023.  His current medications are listed in the chart.  Drug Allergies:  No Known Allergies  Physical Exam: BP 112/60   Pulse (!) 55   Temp 98.4 F (36.9 C) (Temporal)   Resp 16   Ht 5' 11.46 (1.815 m)   Wt 192 lb 12.8 oz (87.5 kg)   SpO2 99%   BMI 26.55 kg/m    Physical Exam Vitals reviewed.  Constitutional:      Appearance: Normal appearance.  HENT:     Head: Normocephalic and atraumatic.     Right Ear: Tympanic membrane normal.     Left Ear: Tympanic membrane normal.      Nose:     Comments: Bilateral nares slightly erythematous with thin clear nasal drainage noted.  Pharynx normal.  Ears normal.  Eyes normal.    Mouth/Throat:     Pharynx: Oropharynx is clear.  Eyes:     Conjunctiva/sclera: Conjunctivae normal.  Cardiovascular:     Rate and Rhythm: Normal rate.     Heart sounds: Normal heart sounds. No murmur heard. Pulmonary:     Effort: Pulmonary effort is normal.     Breath sounds: Normal breath sounds.     Comments: Lungs clear to auscultation Musculoskeletal:        General: Normal range of motion.     Cervical back: Normal range of motion and neck supple.  Skin:    General: Skin is warm and dry.  Neurological:     Mental Status: He is alert and oriented to person, place, and time.  Psychiatric:        Mood and Affect: Mood normal.        Behavior: Behavior normal.        Thought Content: Thought content normal.        Judgment: Judgment normal.    Assessment and Plan: 1. Mild intermittent asthma without complication   2. Seasonal and perennial allergic rhinitis   3. Rash     No orders of the defined types were placed in this encounter.  Patient Instructions  Asthma Continue montelukast  10 mg once a day to prevent cough or wheeze Continue albuterol  2 puffs once every 4 hours if needed for cough or wheeze You may use albuterol  2 puffs 5 to 15 minutes before activity to decrease cough or wheeze  Allergic rhinitis Continue allergen avoidance measures directed toward pollen, dust mite, and mold as listed below Continue allergen immunotherapy and have access to an epinephrine  auto-injector set per protocol Continue cetirizine  10 mg once a day as needed for runny nose or itch Continue Flonase  2 sprays in each nostril once a day if needed for stuffy nose Continue allergen immunotherapy and have access to an epinephrine  autoinjector set per protocol  Rash Continue a twice a day moisturizing routine  Continue triamcinolone  to red  or itchy areas under your face as needed. Do not use this medication longer than 2 weeks in a row.  Keep your appointment with your dermatology specialist on December 18, 2023  Call the clinic if this treatment plan is not working well for you.  Follow up in 1 year or sooner if needed.   Return in about 1 year (around 11/28/2024), or if symptoms worsen or fail to improve.    Thank you for the opportunity to care for this patient.  Please do not hesitate to contact me with questions.  Arlean Mutter, FNP [facility] 

## 2023-11-29 NOTE — Patient Instructions (Addendum)
Asthma  Continue montelukast 10 mg once a day to prevent cough or wheeze  Continue albuterol 2 puffs once every 4 hours if needed for cough or wheeze  You may use albuterol 2 puffs 5 to 15 minutes before activity to decrease cough or wheeze    Allergic rhinitis  Continue allergen avoidance measures directed toward pollen, dust mite, and mold as listed below  Continue allergen immunotherapy and have access to an epinephrine auto-injector set per protocol  Continue cetirizine 10 mg once a day as needed for runny nose or itch  Continue Flonase 2 sprays in each nostril once a day if needed for stuffy nose  Continue allergen immunotherapy and have access to an epinephrine autoinjector set per protocol    Rash  Continue a twice a day moisturizing routine   Continue triamcinolone to red or itchy areas under your face as needed. Do not use this medication longer than 2 weeks in a row.   Keep your appointment with your dermatology specialist on December 18, 2023    Call the clinic if this treatment plan is not working well for you.    Follow up in 1 year or sooner if needed.    Reducing Pollen Exposure  The American Academy of Allergy, Asthma and Immunology suggests the following steps to reduce your exposure to pollen during allergy seasons.  Do not hang sheets or clothing out to dry; pollen may collect on these items.  Do not mow lawns or spend time around freshly cut grass; mowing stirs up pollen.  Keep windows closed at night.  Keep car windows closed while driving.  Minimize morning activities outdoors, a time when pollen counts are usually at their highest.  Stay indoors as much as possible when pollen counts or humidity is high and on windy days when pollen tends to remain in the air longer.  Use air conditioning when possible.  Many air conditioners have filters that trap the pollen spores.  Use a HEPA room air filter to remove pollen form the indoor air you breathe.    Control of Mold Allergen  Mold and fungi can  grow on a variety of surfaces provided certain temperature and moisture conditions exist.  Outdoor molds grow on plants, decaying vegetation and soil.  The major outdoor mold, Alternaria and Cladosporium, are found in very high numbers during hot and dry conditions.  Generally, a late Summer - Fall peak is seen for common outdoor fungal spores.  Rain will temporarily lower outdoor mold spore count, but counts rise rapidly when the rainy period ends.  The most important indoor molds are Aspergillus and Penicillium.  Dark, humid and poorly ventilated basements are ideal sites for mold growth.  The next most common sites of mold growth are the bathroom and the kitchen.    Outdoor [employer]  Use air conditioning and keep windows closed  Avoid exposure to decaying vegetation.  Avoid leaf raking.  Avoid grain handling.  Consider wearing a face mask if working in moldy areas.    Indoor Mold Control  Maintain humidity below 50%.  Clean washable surfaces with 5% bleach solution.  Remove sources e.g. Contaminated carpets.      Control of Dust Mite Allergen  Dust mites play a major role in allergic asthma and rhinitis. They occur in environments with high humidity wherever human skin is found. Dust mites absorb humidity from the atmosphere (ie, they do not drink) and feed on organic matter (including shed human and animal skin).  Dust mites are a microscopic type of insect that you cannot see with the naked eye. High levels of dust mites have been detected from mattresses, pillows, carpets, upholstered furniture, bed covers, clothes, soft toys and any woven material. The principal allergen of the dust mite is found in its feces. A gram of dust may contain 1,000 mites and 250,000 fecal particles. Mite antigen is easily measured in the air during house cleaning activities. Dust mites do not bite and do not cause harm to humans, other than by triggering allergies/asthma.    Ways to decrease your exposure to dust mites in your  home:    1. Encase mattresses, box springs and pillows with a mite-impermeable barrier or cover    2. Wash sheets, blankets and drapes weekly in hot water (130 F) with detergent and dry them in a dryer on the hot setting.    3. Have the room cleaned frequently with a vacuum cleaner and a damp dust-mop. For carpeting or rugs, vacuuming with a vacuum cleaner equipped with a high-efficiency particulate air (HEPA) filter. The dust mite allergic individual should not be in a room which is being cleaned and should wait 1 hour after cleaning before going into the room.    4. Do not sleep on upholstered furniture (eg, couches).    5. If possible removing carpeting, upholstered furniture and drapery from the home is ideal. Horizontal blinds should be eliminated in the rooms where the person spends the most time (bedroom, study, television room). Washable vinyl, roller-type shades are optimal.    6. Remove all non-washable stuffed toys from the bedroom. Wash stuffed toys weekly like sheets and blankets above.    7. Reduce indoor humidity to less than 50%. Inexpensive humidity monitors can be purchased at most hardware stores. Do not use a humidifier as can make the problem worse and are not recommended.

## 2023-11-29 NOTE — Progress Notes (Signed)
-------------------------------------------------------------------------------  Attestation signed by Tyler Spruce, MD at 12/10/2023  6:52 AM  I reviewed the Nurse Practitioner's note and agree with the documented findings and plan of care. We briefly discussed the patient and developed a plan concurrently. He has been on shots for a number of years, so we may consider stopping them in the near future. Hopefully we can get this rash under better control.     Tyler Bonds, MD  [facility]        -------------------------------------------------------------------------------        [address]  [place] [address]  Dept: [phone]    FOLLOW UP NOTE    Patient ID: Tyler Navarro, male    DOB: [birth year]  Age: 23 y.o. MRN: [id number]  Date of Office Visit: 11/29/2023    Assessment  Chief Complaint: Follow-up    HPI  Tyler Navarro is a 23 year old male who presents in clinic for follow-up visit.  He was last seen in this clinic on 11/24/2021 by Dr. Selena Navarro for evaluation of rash, asthma, and allergic rhinitis.      At today's visit, he reports his asthma has been well-controlled with no shortness of breath, cough, or wheeze with activity or rest.  He continues montelukast about 4 days a week and has not used albuterol over the last several years.    Allergic rhinitis is reported as moderately well-controlled with occasional nasal congestion and sneezing as the main symptoms.  He continues cetirizine 10 mg once a day and is not currently using nasal saline rinses or steroid nasal spray.  He continues to receive allergen immunotherapy with his ENT provider, Dr. Darrow Navarro, in [place], [place] where he attends college.     He reports that he develops a red and occasionally itchy rash that occurs on any part of his body and can occur at any time. He denies concomitant cardiopulmonary or gastrointestinal symptoms with this rash.  He reports Benadryl relieves the symptoms of  redness and itch.  He has an initial appointment for evaluation of this rash with a dermatology specialist on December 18, 2023.    His current medications are listed in the chart.    Drug Allergies:   No Known Allergies    Physical Exam:  BP 112/60   Pulse (!) 55   Temp 98.4 F (36.9 C) (Temporal)   Resp 16   Ht 5' 11.46" (1.815 m)   Wt 192 lb 12.8 oz (87.5 kg)   SpO2 99%   BMI 26.55 kg/m      Physical Exam  Vitals reviewed.   Constitutional:       Appearance: Normal appearance.   HENT:      Head: Normocephalic and atraumatic.      Right Ear: Tympanic membrane normal.      Left Ear: Tympanic membrane normal.      Nose:      Comments: Bilateral nares slightly erythematous with thin clear nasal drainage noted.  Pharynx normal.  Ears normal.  Eyes normal.     Mouth/Throat:      Pharynx: Oropharynx is clear.   Eyes:      Conjunctiva/sclera: Conjunctivae normal.   Cardiovascular:      Rate and Rhythm: Normal rate.      Heart sounds: Normal heart sounds. No murmur heard.  Pulmonary:      Effort: Pulmonary effort is normal.      Breath sounds: Normal breath sounds.  Comments: Lungs clear to auscultation  Musculoskeletal:         General: Normal range of motion.      Cervical back: Normal range of motion and neck supple.   Skin:     General: Skin is warm and dry.   Neurological:      Mental Status: He is alert and oriented to person, place, and time.   Psychiatric:         Mood and Affect: Mood normal.         Behavior: Behavior normal.         Thought Content: Thought content normal.         Judgment: Judgment normal.       Assessment and Plan:  1. Mild intermittent asthma without complication    2. Seasonal and perennial allergic rhinitis    3. Rash        No orders of the defined types were placed in this encounter.      Patient Instructions   Asthma  Continue montelukast 10 mg once a day to prevent cough or wheeze  Continue albuterol 2 puffs once every 4 hours if needed for cough or wheeze  You may use  albuterol 2 puffs 5 to 15 minutes before activity to decrease cough or wheeze    Allergic rhinitis  Continue allergen avoidance measures directed toward pollen, dust mite, and mold as listed below  Continue allergen immunotherapy and have access to an epinephrine auto-injector set per protocol  Continue cetirizine 10 mg once a day as needed for runny nose or itch  Continue Flonase 2 sprays in each nostril once a day if needed for stuffy nose  Continue allergen immunotherapy and have access to an epinephrine autoinjector set per protocol    Rash  Continue a twice a day moisturizing routine   Continue triamcinolone to red or itchy areas under your face as needed. Do not use this medication longer than 2 weeks in a row.   Keep your appointment with your dermatology specialist on December 18, 2023    Call the clinic if this treatment plan is not working well for you.    Follow up in 1 year or sooner if needed.      Return in about 1 year (around 11/28/2024), or if symptoms worsen or fail to improve.       Thank you for the opportunity to care for this patient.  Please do not hesitate to contact me with questions.    Tyler Leyland, FNP  [facility]

## 2023-12-05 ENCOUNTER — Ambulatory Visit (INDEPENDENT_AMBULATORY_CARE_PROVIDER_SITE_OTHER): Payer: BC Managed Care – PPO

## 2023-12-05 DIAGNOSIS — J309 Allergic rhinitis, unspecified: Secondary | ICD-10-CM | POA: Diagnosis not present

## 2023-12-13 ENCOUNTER — Other Ambulatory Visit: Payer: Self-pay

## 2023-12-13 ENCOUNTER — Ambulatory Visit (INDEPENDENT_AMBULATORY_CARE_PROVIDER_SITE_OTHER): Payer: BC Managed Care – PPO

## 2023-12-14 ENCOUNTER — Ambulatory Visit (INDEPENDENT_AMBULATORY_CARE_PROVIDER_SITE_OTHER): Payer: BC Managed Care – PPO

## 2023-12-14 ENCOUNTER — Other Ambulatory Visit (INDEPENDENT_AMBULATORY_CARE_PROVIDER_SITE_OTHER): Payer: Self-pay | Admitting: Otolaryngology

## 2023-12-14 DIAGNOSIS — J301 Allergic rhinitis due to pollen: Secondary | ICD-10-CM

## 2023-12-14 DIAGNOSIS — Z91038 Other insect allergy status: Secondary | ICD-10-CM

## 2023-12-14 DIAGNOSIS — J3089 Other allergic rhinitis: Secondary | ICD-10-CM

## 2023-12-14 MED ORDER — EPINEPHRINE 0.3 MG/0.3 ML INJECTION, AUTO-INJECTOR
0.3000 mg | AUTO-INJECTOR | Freq: Once | INTRAMUSCULAR | 0 refills | Status: AC | PRN
Start: 2023-12-14 — End: ?

## 2023-12-14 NOTE — Nursing Note (Signed)
12/14/23 1300 12/14/23 1316   Mixed Vials   Comments Last dose at outside clinic was 0.57ml on 12/04/2022 from both vial 1 and vial 2 No injection. Pt did not have valid epipen. Script routed to provider for approval. Pt to come back next week.   Allergy Injection Flowsheet   Managing Physician Ramadan Ramadan   Initials LE LE

## 2023-12-14 NOTE — Telephone Encounter (Signed)
Refill for epipen routed to provider.

## 2023-12-18 ENCOUNTER — Other Ambulatory Visit: Payer: Self-pay

## 2023-12-18 ENCOUNTER — Encounter (HOSPITAL_BASED_OUTPATIENT_CLINIC_OR_DEPARTMENT_OTHER): Payer: Self-pay | Admitting: GENERAL PRACTICE

## 2023-12-18 ENCOUNTER — Ambulatory Visit
Payer: BC Managed Care – PPO | Attending: Student in an Organized Health Care Education/Training Program | Admitting: GENERAL PRACTICE

## 2023-12-18 VITALS — Temp 98.6°F | Ht 72.0 in | Wt 182.3 lb

## 2023-12-18 DIAGNOSIS — J309 Allergic rhinitis, unspecified: Secondary | ICD-10-CM | POA: Insufficient documentation

## 2023-12-18 DIAGNOSIS — L508 Other urticaria: Secondary | ICD-10-CM | POA: Insufficient documentation

## 2023-12-18 DIAGNOSIS — L503 Dermatographic urticaria: Secondary | ICD-10-CM | POA: Insufficient documentation

## 2023-12-18 NOTE — Progress Notes (Signed)
Dermatology Clinic, [facility]  [address]  [place] [address]  [phone]    Date:   12/18/2023  Name: Tyler Navarro  Age: 23 y.o.  Last visit: Visit date not found    Chief complaint: rash     HPI  Tyler Navarro is a 23 y.o. male here for rash that comes and goes. Notes it has worsened significantly since this past October and typically worsens when he is here in [place] compared to when he is at home in [place]. Rash appears in various locations on his body including arms, trunk, legs. It is pruritic at times and other times just there. Notes hives that are sometimes associated. Improves with Benadryl. Has history of allergic rhinitis and allergies for which he does allergy shots. He currently follows with ENT for this. He currently is on zyrtec 20 mg daily, fexofenadine 360 mg daily, and montelukast 10 mg daily.      No other new, changing, bleeding, or symptomatic lesions or moles. No other skin-related complaints.       ROS   Constitutional: Negative for chills and fever.   Skin: Negative for itching and rash.       Current Medications  Outpatient Encounter Medications as of 12/18/2023   Medication Sig Dispense Refill    cetirizine (ZYRTEC) 10 mg Oral Tablet Take 1 Tablet (10 mg total) by mouth Once a day      EPINEPHrine 0.3 mg/0.3 mL Injection Auto-Injector Inject 0.3 mL (0.3 mg total) into the muscle Once, as needed for up to 2 doses 2 Each 0    fexofenadine (ALLEGRA) 180 mg Oral Tablet Take 1 Tablet (180 mg total) by mouth Once a day      montelukast (SINGULAIR) 10 mg Oral Tablet Take 1 Tablet (10 mg total) by mouth Every evening 90 Tablet 3    [DISCONTINUED] EPINEPHrine 0.3 mg/0.3 mL Injection Auto-Injector Inject 0.3 mL (0.3 mg total) into the muscle Once, as needed for up to 1 dose 1 Each 0     No facility-administered encounter medications on file as of 12/18/2023.        Allergies   Allergen Reactions    Grass Pollen     House Dust     Tree And Shrub Pollen          Past Medical History:   Diagnosis Date    Allergic rhinitis     Allergy             Physical Exam  Vitals:    Temp:  [37 C (98.6 F)] 37 C (98.6 F) (01/21 1443)   Vitals:    12/18/23 1443   Temp: 37 C (98.6 F)   Weight: 82.7 kg (182 lb 5.1 oz)   Height: 1.829 m (6')        Physical Exam  Skin:              Constitutional: he appears well-developed and well-nourished.   Skin: Skin is warm and dry.       Assessment and Plan  #Dermatographism (#1 above)   #Dermatographic urticaria  - Benign etiology discussed  - Discussed do not favor that this is due to allergy shots and would not recommend stopping allergies shots due to this rash  - Recommend referral to allergy and immunology to discuss other treatment options such as xolair or other antihistamines.      RTC PRN.     Clent Ridges, MD 12/18/2023 15:02  Dermatology  Resident, PGY-3      See resident's note for details.  I saw and examined the patient and agree with the resident's findings and plan as written except as noted.  For minor surgical procedures (lasting less than five minutes), I was present during the entire service. For major procedures (lasting more than five minutes),  I was  physically present during the key portion(s) of the service including but not limited to discussing risks and benefits with pt, planning and drawing the surgical plan and excision of the lesion  I was immediately available to furnish service during the entire procedure.  Katha Hamming, MD

## 2023-12-19 ENCOUNTER — Ambulatory Visit (INDEPENDENT_AMBULATORY_CARE_PROVIDER_SITE_OTHER): Payer: Self-pay

## 2023-12-19 DIAGNOSIS — J3089 Other allergic rhinitis: Secondary | ICD-10-CM

## 2023-12-19 DIAGNOSIS — Z91038 Other insect allergy status: Secondary | ICD-10-CM

## 2023-12-19 DIAGNOSIS — J301 Allergic rhinitis due to pollen: Secondary | ICD-10-CM

## 2023-12-19 NOTE — Nursing Note (Signed)
12/19/23 1000 12/19/23 1020   Allergy Injection Flowsheet   Date of Injection 12/19/23 12/19/23   Allergy Vial Identification Number [id number]   Any Reaction to Previous Administration? No No   Any Illness/ Fever in Last 24 hours?* No No   Any Change in Medication? No No   Is the patient on a beta blocker? No No   Does the patient have a valid EpiPen at visit? Yes Yes   Does patient have a history of Asthma? No No   Expiration date 01/28/24 01/28/24   Dose 0.63ml 0.89ml   Maintenance Dose 0.42ml 0.45ml   Site Left arm Right arm   Patient tolerated procedure well yes yes   Allergy Diagnosis J30.1 - allergic rhinitis due to pollen J30.89 - allergic rhinitis due to dust mite;J30.89 - allergic rhinitis due to mold   Time given 1022 1022   Time read 1042 1042   Patient declined to wait 20 minutes No No   Managing Physician Ramadan Ramadan   Initials cs cs   Were the vials mailed? No No   VIAL   Comments [id number]  --

## 2023-12-27 ENCOUNTER — Ambulatory Visit (INDEPENDENT_AMBULATORY_CARE_PROVIDER_SITE_OTHER): Payer: Self-pay

## 2023-12-28 ENCOUNTER — Other Ambulatory Visit: Payer: Self-pay

## 2023-12-28 ENCOUNTER — Ambulatory Visit (INDEPENDENT_AMBULATORY_CARE_PROVIDER_SITE_OTHER): Payer: BC Managed Care – PPO

## 2023-12-28 DIAGNOSIS — J3089 Other allergic rhinitis: Secondary | ICD-10-CM

## 2023-12-28 DIAGNOSIS — J301 Allergic rhinitis due to pollen: Secondary | ICD-10-CM

## 2023-12-28 DIAGNOSIS — Z91038 Other insect allergy status: Secondary | ICD-10-CM

## 2023-12-28 NOTE — Nursing Note (Signed)
12/28/23 1300 12/28/23 1312   Allergy Injection Flowsheet   Date of Injection 12/28/23 12/28/23   Allergy Vial Identification Number [id number]   Any Reaction to Previous Administration? No No   Any Illness/ Fever in Last 24 hours?* No No   Any Change in Medication? No No   Is the patient on a beta blocker? No No   Does the patient have a valid EpiPen at visit? Yes Yes   Does patient have a history of Asthma? No No   Expiration date 01/28/24 01/28/24   Dose 0.39ml 0.93ml   Maintenance Dose 0.19ml 0.18ml   Site Left arm Right arm   Patient tolerated procedure well yes yes   Allergy Diagnosis J30.1 - allergic rhinitis due to pollen J30.89 - allergic rhinitis due to dust mite;J30.89 - allergic rhinitis due to mold   Time given 1308 1308   Time read 1328 1328   Patient declined to wait 20 minutes No No   Managing Physician Ramadan Ramadan   Initials ts ts   Were the vials mailed? No No

## 2024-01-04 ENCOUNTER — Ambulatory Visit (INDEPENDENT_AMBULATORY_CARE_PROVIDER_SITE_OTHER): Payer: BC Managed Care – PPO

## 2024-01-04 ENCOUNTER — Other Ambulatory Visit: Payer: Self-pay

## 2024-01-04 DIAGNOSIS — J301 Allergic rhinitis due to pollen: Secondary | ICD-10-CM

## 2024-01-04 DIAGNOSIS — J3089 Other allergic rhinitis: Secondary | ICD-10-CM

## 2024-01-04 DIAGNOSIS — Z91038 Other insect allergy status: Secondary | ICD-10-CM

## 2024-01-04 NOTE — Nursing Note (Signed)
01/04/24 1300 01/04/24 1327   Allergy Injection Flowsheet   Date of Injection 01/04/24 01/04/24   Allergy Vial Identification Number [id number]   Any Reaction to Previous Administration? No No   Any Illness/ Fever in Last 24 hours?* No No   Any Change in Medication? No No   Is the patient on a beta blocker? No No   Does the patient have a valid EpiPen at visit? Yes Yes   Does patient have a history of Asthma? No No   Expiration date 01/28/24 01/28/24   Dose 0.35cc 0.35cc   Maintenance Dose 0.50cc 0.50cc   Site Left arm Right arm   Patient tolerated procedure well yes yes   Allergy Diagnosis J30.1 - allergic rhinitis due to pollen J30.89 - allergic rhinitis due to dust mite;J30.89 - allergic rhinitis due to mold   Time given 1327 1327   Time read 1347 1347   Patient declined to wait 20 minutes No No   Managing Physician Ramadan Ramadan   Initials LE LE   Were the vials mailed? No No

## 2024-01-11 ENCOUNTER — Other Ambulatory Visit: Payer: Self-pay

## 2024-01-11 ENCOUNTER — Ambulatory Visit (INDEPENDENT_AMBULATORY_CARE_PROVIDER_SITE_OTHER): Payer: BC Managed Care – PPO

## 2024-01-11 DIAGNOSIS — J301 Allergic rhinitis due to pollen: Secondary | ICD-10-CM

## 2024-01-11 DIAGNOSIS — J3089 Other allergic rhinitis: Secondary | ICD-10-CM

## 2024-01-11 DIAGNOSIS — Z91038 Other insect allergy status: Secondary | ICD-10-CM

## 2024-01-11 NOTE — Nursing Note (Signed)
 01/11/24 1300 01/11/24 1331   Allergy Injection Flowsheet   Date of Injection 01/11/24 01/11/24   Allergy Vial Identification Number [id number]   Any Reaction to Previous Administration? No No   Any Illness/ Fever in Last 24 hours?* No No   Any Change in Medication? No No   Is the patient on a beta blocker? No No   Does the patient have a valid EpiPen at visit? Yes Yes   Does patient have a history of Asthma? No No   Expiration date 01/28/24 01/28/24   Dose 0.43ml 0.16ml   Maintenance Dose 0.5ml 0.60ml   Site Left arm Right arm   Patient tolerated procedure well yes yes   Allergy Diagnosis J30.1 - allergic rhinitis due to pollen J30.89 - allergic rhinitis due to dust mite;J30.89 - allergic rhinitis due to mold   Time given 1331 1331   Time read 1351 1351   Patient declined to wait 20 minutes No No   Managing Physician Ramadan Ramadan   Initials ts ts   Were the vials mailed? No No

## 2024-01-18 ENCOUNTER — Ambulatory Visit (INDEPENDENT_AMBULATORY_CARE_PROVIDER_SITE_OTHER): Payer: BC Managed Care – PPO

## 2024-01-18 ENCOUNTER — Other Ambulatory Visit: Payer: Self-pay

## 2024-01-18 DIAGNOSIS — J301 Allergic rhinitis due to pollen: Secondary | ICD-10-CM

## 2024-01-18 DIAGNOSIS — J3089 Other allergic rhinitis: Secondary | ICD-10-CM

## 2024-01-18 DIAGNOSIS — Z91038 Other insect allergy status: Secondary | ICD-10-CM

## 2024-01-18 NOTE — Nursing Note (Signed)
 01/18/24 1300 01/18/24 1318   Mixed Vials   Comments Remix  --    Allergy Injection Flowsheet   Date of Injection 01/18/24 01/18/24   Allergy Vial Identification Number [id number]   Any Reaction to Previous Administration? No No   Any Illness/ Fever in Last 24 hours?* No No   Any Change in Medication? No No   Is the patient on a beta blocker? No No   Does the patient have a valid EpiPen at visit? Yes Yes   Does patient have a history of Asthma? No No   Expiration date 01/28/24 01/28/24   Dose 0.2ml 0.51ml   Maintenance Dose 0.79ml 0.54ml   Site Left arm Right arm   Patient tolerated procedure well yes yes   Allergy Diagnosis J30.1 - allergic rhinitis due to pollen J30.89 - allergic rhinitis due to dust mite;J30.89 - allergic rhinitis due to mold   Time given 1320 1320   Time read 1340 1340   Patient declined to wait 20 minutes No No   Managing Physician Ramadan Ramadan   Initials af af   Were the vials mailed? No No   VIAL   Comments  --  04/2025     Korey Arroyo Feather, LPN

## 2024-01-25 ENCOUNTER — Other Ambulatory Visit (INDEPENDENT_AMBULATORY_CARE_PROVIDER_SITE_OTHER): Payer: Self-pay | Admitting: Otolaryngology

## 2024-01-25 ENCOUNTER — Other Ambulatory Visit: Payer: Self-pay

## 2024-01-25 ENCOUNTER — Ambulatory Visit (INDEPENDENT_AMBULATORY_CARE_PROVIDER_SITE_OTHER): Payer: Self-pay

## 2024-01-25 DIAGNOSIS — Z91038 Other insect allergy status: Secondary | ICD-10-CM

## 2024-01-25 DIAGNOSIS — J301 Allergic rhinitis due to pollen: Secondary | ICD-10-CM

## 2024-01-25 DIAGNOSIS — J3089 Other allergic rhinitis: Secondary | ICD-10-CM

## 2024-01-25 NOTE — Nursing Note (Signed)
 01/25/24 1000 01/25/24 1019   Allergy Injection Flowsheet   Date of Injection 01/25/24 01/25/24   Allergy Vial Identification Number [id number]   Any Reaction to Previous Administration? No No   Any Illness/ Fever in Last 24 hours?* No No   Any Change in Medication? No No   Is the patient on a beta blocker? No No   Does the patient have a valid EpiPen at visit? Yes Yes   Does patient have a history of Asthma? No No   Expiration date 01/28/24 01/28/24   Dose 0.1ml 0.13ml   Maintenance Dose 0.51ml 0.55ml   Site Left arm Right arm   Patient tolerated procedure well yes yes   Allergy Diagnosis J30.1 - allergic rhinitis due to pollen J30.89 - allergic rhinitis due to dust mite;J30.89 - allergic rhinitis due to mold   Time given 1018 1019   Time read 1038 1039   Patient declined to wait 20 minutes No No   Managing Physician Ramadan Ramadan   Initials ss ss   Were the vials mailed? No No   EpiPen Expiration date 05/26/25  --

## 2024-01-29 ENCOUNTER — Other Ambulatory Visit (INDEPENDENT_AMBULATORY_CARE_PROVIDER_SITE_OTHER): Payer: Self-pay | Admitting: Otolaryngology

## 2024-01-29 DIAGNOSIS — J301 Allergic rhinitis due to pollen: Secondary | ICD-10-CM

## 2024-01-29 DIAGNOSIS — J3089 Other allergic rhinitis: Secondary | ICD-10-CM

## 2024-01-31 NOTE — Nursing Note (Signed)
 01/29/24 1507   ENT Immunotherapy Vial Preparation Flowsheet   Allergy Test Date 02/16/22   Immunotherapy Start Date 03/16/22   Type of Immunotherapy? Subcutaneous   Managing Physician Nayomi Tabron   Compounding Staff Waymond Cera   [place] #1   [place] #1 Identification Number [id number]   Preparation Date 01/29/24   Expiration Date 04/30/24   Immunotherapy Status No escalation   Diagnosis Allergic Rhinitis due to pollen (J30.1)   Ragweed, Short 1:20 W/V   Ragweed, Short Initial Endpoint 5   Dilution (10% Glycerin) Used 4   Volume of Dilution Used 0.78ml   Lot Number [id number]   Expiration Date 05/15/24   Manufacturer ALK   Pigweed, Rough/Redroot 1:20 W/V   Pigweed, Rough/Redroot Initial Endpoint 5   Dilution (10% Glycerin) Used 4   Volume of Dilution Used 0.17ml   Lot Number [id number]   Expiration Date 05/15/24   Manufacturer ALK   Plantain, English 1:20 W/V   Plantain, English Initial Endpoint 5   Dilution (10% Glycerin) Used 4   Volume of Dilution Used 0.47ml   Lot Number [id number]   Expiration Date 05/15/24   Manufacturer ALK   Lamb's Quarters 1:20 W/V   Lamb's Quarters Initial Endpoint 4   Dilution (10% Glycerin) Used 3   Volume of Dilution Used 0.49ml   Lot Number [id number]   Expiration Date 05/15/24   Manufacturer ALK   Johnson Grass 1:20 W/V   Johnson Grass Initial Endpoint 5   Dilution (10% Glycerin) Used 4   Volume of Dilution Used 0.61ml   Lot Number [id number]   Expiration Date 05/15/24   Manufacturer ALK   Red Oak 1:20 W/V   Red Oak Initial Endpoint 5   Dilution (10% Glycerin) Used 4    Volume of Dilution Used 0.72ml   Lot Number [id number]   Expiration Date 05/15/24   Manufacturer ALK   [place] 1:20 W/V   [place] Initial Endpoint 5   Dilution (10% Glycerin) Used 4    Volume of Dilution Used 0.51ml   Lot Number [id number]   Expiration Date 05/15/24   Manufacturer ALK   Charletta Cousin, Red 1:20 W/V   Charletta Cousin, Red Initial Endpoint 5   Dilution (10% Glycerin) Used 4    Volume of Dilution Used 0.7ml   Lot  Number [id number]   Expiration Date 05/15/24   Manufacturer ALK   Sycamore, American Eastern 1:20 W/V   [place], American [place] Initial Endpoint 5   Dilution (10% Glycerin) Used 4   Volume of Dilution Used 0.10ml   Lot Number [id number]   Expiration Date 05/15/24   Manufacturer ALK   Other Vial #1   Antigen Volume 1.8   Sterile Diluent Normal Saline Volume 3.2   Total Volume 5ml   Sterile Diluent Normal Saline Lot Number [id number]   Sterile Diluent Normal Saline Expiration Date 12/27/25   Sterile Diluent Normal Saline Manufacturer ALK   Ceasar Mons #2 Identification Number [id number]   Preparation Date 01/29/24   Expiration Date 04/30/24   Immunotherapy Status No escalation   Diagnosis Allergic Rhinitis due to dustmite (J30.89);Allergic Rhinitis due to mold (J30.89)   Dustmite Farinae 10,000 AU/ml   Dustmite Farinae Initial Endpoint 2    Dilution (10% Glycerin) Used 1   Volume of Dilution Used 0.64ml   Lot Number [id number]   Expiration Date 05/15/24   Manufacturer ALK   Dustmite Pteronyssinus 10,000 AU/ml   Dustmite Pteronyssinus Initial Endpoint 4  Dilution (10% Glycerin) Used 3   Volume of Dilution Used 0.27ml   Lot Number [id number]   Expiration Date 05/15/24   Manufacturer ALK   Alternaria 1:40 W/V   Alternaria Initial Endpoint 4    Dilution (10% Glycerin) Used 3   Volume of Dilution Used 0.80ml   Lot Number [id number]   Expiration Date 05/15/24   Manufacturer ALK   Bipoloris Sorokiniana 1:40 W/V   Bipoloris Sorokiniana Initial Endpoint 3   Dilution (10% Glycerin) Used 2   Volume of Dilution Used 0.19ml   Lot Number [id number]   Expiration Date 05/30/25   Manufacturer ALK   Other Vial #2   Antigen Volume 0.8   Sterile Diluent Normal Saline Volume 4.2   Total Volume 5ml   Sterile Diluent Normal Saline Lot Number [id number]   Sterile Diluent Normal Saline Expiration Date 12/27/25   Sterile Diluent Normal Saline Manufacturer ALK   2 (10 unit) vials mixed.

## 2024-02-01 ENCOUNTER — Encounter (INDEPENDENT_AMBULATORY_CARE_PROVIDER_SITE_OTHER): Payer: Self-pay

## 2024-02-05 ENCOUNTER — Encounter (HOSPITAL_COMMUNITY): Payer: Self-pay

## 2024-02-08 ENCOUNTER — Other Ambulatory Visit: Payer: Self-pay

## 2024-02-08 ENCOUNTER — Ambulatory Visit (INDEPENDENT_AMBULATORY_CARE_PROVIDER_SITE_OTHER): Payer: Self-pay

## 2024-02-08 DIAGNOSIS — Z91038 Other insect allergy status: Secondary | ICD-10-CM

## 2024-02-08 DIAGNOSIS — J3089 Other allergic rhinitis: Secondary | ICD-10-CM

## 2024-02-08 DIAGNOSIS — J301 Allergic rhinitis due to pollen: Secondary | ICD-10-CM

## 2024-02-08 NOTE — Nursing Note (Signed)
 02/08/24 1300 02/08/24 1342   Mixed Vials   Comments NAV Pt taking vials and instructions to outside clinic to administer over spring break.   Allergy Injection Flowsheet   Date of Injection 02/08/24 02/08/24   Allergy Vial Identification Number [id number]   Any Reaction to Previous Administration? No No   Any Illness/ Fever in Last 24 hours?* No No   Any Change in Medication? No No   Is the patient on a beta blocker? No No   Does the patient have a valid EpiPen at visit? Yes Yes   Does patient have a history of Asthma? No No   Expiration date 04/30/24 04/30/24   Dose 0.25ML 0.72ml   Maintenance Dose 0.50ML 0.3ml   Site Left arm Right arm   Patient tolerated procedure well yes yes   Allergy Diagnosis J30.1 - allergic rhinitis due to pollen J30.89 - allergic rhinitis due to dust mite;J30.89 - allergic rhinitis due to mold   Time given 1341 1343   Time read 1401 1403   Patient declined to wait 20 minutes No No   Managing Physician Ramadan Ramadan   Initials SS ss   Were the vials mailed? No No   EpiPen Expiration date 05/26/25  --

## 2024-02-15 ENCOUNTER — Ambulatory Visit (INDEPENDENT_AMBULATORY_CARE_PROVIDER_SITE_OTHER): Payer: Self-pay

## 2024-02-15 DIAGNOSIS — J309 Allergic rhinitis, unspecified: Secondary | ICD-10-CM

## 2024-02-19 ENCOUNTER — Ambulatory Visit (INDEPENDENT_AMBULATORY_CARE_PROVIDER_SITE_OTHER): Payer: Self-pay | Admitting: Otolaryngology

## 2024-02-19 ENCOUNTER — Other Ambulatory Visit: Payer: Self-pay

## 2024-02-19 VITALS — BP 128/64 | HR 70 | Temp 97.5°F | Ht 72.0 in | Wt 184.7 lb

## 2024-02-19 DIAGNOSIS — J343 Hypertrophy of nasal turbinates: Secondary | ICD-10-CM

## 2024-02-19 DIAGNOSIS — J309 Allergic rhinitis, unspecified: Secondary | ICD-10-CM

## 2024-02-19 NOTE — Telephone Encounter (Signed)
 Note placed in flowsheet.          Tula Gain, LPN  Purcell Bruce, RN  Simonne Dubonnet,    Dr. Ramadan wants to increase the strength for vial 2 only when he gets vials mixed next.    Thank you!

## 2024-02-19 NOTE — Progress Notes (Signed)
 ENT, [facility]  [address] [place]  [place] [address]  [phone]    PATIENT NAME:  Tyler Navarro  MRN:  [id number]  DOB:  2001-09-12  DATE OF SERVICE: 02/19/2024    Chief Complaint:  Allergies (Follow up /)      HPI:  Zakariah Urwin is a 23 y.o. male presenting in here today for follow-up of his allergies. He has been doing fairly well on them. He has been on them for almost 2 years now. He has not had any reactions and he does notice an improvement in allergy symptoms. He did have a rash at some point, which was not related to the allergies for which he is seeing Dermatology. They told him that he needs to be on Zyrtec, which he has been. Since his last visit, however, he has not been having any rash issues at all. He is tolerating shots. He feels improved.       Medications:  Outpatient Medications Marked as Taking for the 02/19/24 encounter (Office Visit) with Neida Ellegood H, MD   Medication Sig   . cetirizine (ZYRTEC) 10 mg Oral Tablet Take 1 Tablet (10 mg total) by mouth Daily   . EPINEPHrine  0.3 mg/0.3 mL Injection Auto-Injector Inject 0.3 mL (0.3 mg total) into the muscle Once, as needed for up to 2 doses   . fexofenadine (ALLEGRA) 180 mg Oral Tablet Take 1 Tablet (180 mg total) by mouth Daily   . montelukast  (SINGULAIR ) 10 mg Oral Tablet Take 1 Tablet (10 mg total) by mouth Every evening       Allergies:  Allergies   Allergen Reactions   . Grass Pollen    . House Dust    . Tree And Shrub Pollen          Physical Exam:  Blood pressure 128/64, pulse 70, temperature 36.4 C (97.5 F), temperature source Temporal, height 1.829 m (6'), weight 83.8 kg (184 lb 11.9 oz), SpO2 98%.  Body mass index is 25.06 kg/m.  General Appearance: Pleasant, cooperative, healthy, and in no acute distress.  Eyes: Conjunctivae/corneas clear.  Head and Face: Face symmetric, no obvious lesions.   External auditory canals:  Patent without inflammation.  Tympanic membranes:  Intact, translucent,  midposition, middle ear aerated.  Nose: Some turbinate hypertrophy. Negative congestion.    Oral Cavity/Oropharynx: No mucosal lesions, masses, or pharyngeal asymmetry.    Procedure:             Data Reviewed:    Allergy testing reviewed.    Assessment:      ICD-10-CM    1. Allergic rhinitis  J30.9       2. Nasal turbinate hypertrophy  J34.3            Plan:  I am going to escalate his right arm at the present time, and keep the left as is. I will see him back for follow-up in 3 months. If he does well, then we will escalate the left next, and then afterwards we can escalate them both together.     Otilia Bloch Govind Furey, MD     kll

## 2024-02-22 ENCOUNTER — Ambulatory Visit (INDEPENDENT_AMBULATORY_CARE_PROVIDER_SITE_OTHER): Payer: Self-pay

## 2024-02-22 ENCOUNTER — Other Ambulatory Visit: Payer: Self-pay

## 2024-02-22 DIAGNOSIS — Z91038 Other insect allergy status: Secondary | ICD-10-CM

## 2024-02-22 DIAGNOSIS — J3089 Other allergic rhinitis: Secondary | ICD-10-CM

## 2024-02-22 DIAGNOSIS — J301 Allergic rhinitis due to pollen: Secondary | ICD-10-CM

## 2024-02-22 NOTE — Nursing Note (Signed)
 02/22/24 1300 02/22/24 1331   Allergy Injection Flowsheet   Date of Injection 02/22/24 02/22/24   Allergy Vial Identification Number [id number]   Any Reaction to Previous Administration? No No   Any Illness/ Fever in Last 24 hours?* No No   Any Change in Medication? No No   Is the patient on a beta blocker? No No   Does the patient have a valid EpiPen  at visit? Yes Yes   Does patient have a history of Asthma? No No   Expiration date 04/30/24 04/30/24   Dose 0.50cc 0.50cc   Maintenance Dose 0.50cc 0.50cc   Site Left arm Right arm   Patient tolerated procedure well yes yes   Allergy Diagnosis J30.1 - allergic rhinitis due to pollen J30.89 - allergic rhinitis due to dust mite;J30.89 - allergic rhinitis due to mold   Time given 1331 1331   Patient declined to wait 20 minutes No No   Managing Physician Ramadan Ramadan   Initials jcs jcs

## 2024-02-29 ENCOUNTER — Ambulatory Visit (INDEPENDENT_AMBULATORY_CARE_PROVIDER_SITE_OTHER)

## 2024-02-29 ENCOUNTER — Other Ambulatory Visit: Payer: Self-pay

## 2024-02-29 ENCOUNTER — Ambulatory Visit (INDEPENDENT_AMBULATORY_CARE_PROVIDER_SITE_OTHER): Payer: Self-pay

## 2024-02-29 DIAGNOSIS — J3089 Other allergic rhinitis: Secondary | ICD-10-CM

## 2024-02-29 DIAGNOSIS — Z91038 Other insect allergy status: Secondary | ICD-10-CM

## 2024-02-29 DIAGNOSIS — J301 Allergic rhinitis due to pollen: Secondary | ICD-10-CM

## 2024-02-29 NOTE — Nursing Note (Signed)
 02/29/24 1400 02/29/24 1421   Allergy Injection Flowsheet   Date of Injection 02/29/24 02/29/24   Allergy Vial Identification Number [id number]   Any Reaction to Previous Administration? No No   Any Illness/ Fever in Last 24 hours?* No No   Any Change in Medication? No No   Is the patient on a beta blocker? No No   Does the patient have a valid EpiPen  at visit? Yes Yes   Does patient have a history of Asthma? No No   Expiration date 04/30/24 04/30/24   Dose 0.50 ml 0.50 ml   Maintenance Dose 0.50 ml 0.50 ml   Site Left arm Right arm   Patient tolerated procedure well yes yes   Allergy Diagnosis J30.1 - allergic rhinitis due to pollen J30.89 - allergic rhinitis due to dust mite;J30.89 - allergic rhinitis due to mold   Time given 1423 1423   Patient declined to wait 20 minutes Yes Yes   Managing Physician Ramadan Ramadan   Initials mk/af mk/af   Were the vials mailed? No No   EpiPen  Expiration date  --  05/26/24     Bryna Car, RN

## 2024-03-06 ENCOUNTER — Ambulatory Visit (INDEPENDENT_AMBULATORY_CARE_PROVIDER_SITE_OTHER): Payer: Self-pay

## 2024-03-06 ENCOUNTER — Other Ambulatory Visit: Payer: Self-pay

## 2024-03-06 DIAGNOSIS — J301 Allergic rhinitis due to pollen: Secondary | ICD-10-CM

## 2024-03-06 DIAGNOSIS — J3089 Other allergic rhinitis: Secondary | ICD-10-CM

## 2024-03-06 DIAGNOSIS — Z91038 Other insect allergy status: Secondary | ICD-10-CM

## 2024-03-06 NOTE — Nursing Note (Signed)
 03/06/24 1500 03/06/24 1532   Allergy Injection Flowsheet   Date of Injection 03/06/24 03/06/24   Allergy Vial Identification Number [id number]   Any Reaction to Previous Administration? No No   Any Illness/ Fever in Last 24 hours?* No No   Any Change in Medication? No No   Is the patient on a beta blocker? No No   Does the patient have a valid EpiPen at visit? Yes Yes   Does patient have a history of Asthma? No No   Expiration date 04/30/24 04/30/24   Dose 0.79ml 0.11ml   Maintenance Dose 0.26ml 0.79ml   Site Left arm Right arm   Patient tolerated procedure well yes yes   Allergy Diagnosis J30.1 - allergic rhinitis due to pollen J30.89 - allergic rhinitis due to mold;J30.89 - allergic rhinitis due to dust mite   Time given 1535 1535   Patient declined to wait 20 minutes Yes Yes   Managing Physician Ramadan Ramadan   Initials ga ga   Were the vials mailed? No No   EpiPen Expiration date  --  05/26/25     Trudee Kuster, LPN

## 2024-03-14 ENCOUNTER — Ambulatory Visit (INDEPENDENT_AMBULATORY_CARE_PROVIDER_SITE_OTHER): Payer: Self-pay

## 2024-03-14 ENCOUNTER — Other Ambulatory Visit: Payer: Self-pay

## 2024-03-14 DIAGNOSIS — Z91038 Other insect allergy status: Secondary | ICD-10-CM

## 2024-03-14 DIAGNOSIS — J3089 Other allergic rhinitis: Secondary | ICD-10-CM

## 2024-03-14 DIAGNOSIS — J301 Allergic rhinitis due to pollen: Secondary | ICD-10-CM

## 2024-03-14 NOTE — Nursing Note (Signed)
 03/14/24 1300 03/14/24 1312   Allergy Injection Flowsheet   Date of Injection 03/14/24 03/14/24   Allergy Vial Identification Number [id number]   Any Reaction to Previous Administration? No No   Any Illness/ Fever in Last 24 hours?* No No   Any Change in Medication? No No   Is the patient on a beta blocker? No No   Does the patient have a valid EpiPen  at visit? Yes Yes   Does patient have a history of Asthma? No No   Expiration date 04/30/24 04/30/24   Dose 0.51ml 0.45ml   Maintenance Dose 0.66ml 0.36ml   Site Left arm Right arm   Patient tolerated procedure well yes yes   Allergy Diagnosis J30.1 - allergic rhinitis due to pollen J30.89 - allergic rhinitis due to mold;J30.89 - allergic rhinitis due to dust mite   Time given 1312 1312   Patient declined to wait 20 minutes Yes Yes   Managing Physician Ramadan Ramadan   Initials ss ss   Were the vials mailed? No No   EpiPen  Expiration date 05/26/25  --

## 2024-03-21 ENCOUNTER — Ambulatory Visit (INDEPENDENT_AMBULATORY_CARE_PROVIDER_SITE_OTHER): Payer: Self-pay

## 2024-03-21 ENCOUNTER — Other Ambulatory Visit: Payer: Self-pay

## 2024-03-21 DIAGNOSIS — Z91038 Other insect allergy status: Secondary | ICD-10-CM

## 2024-03-21 DIAGNOSIS — J301 Allergic rhinitis due to pollen: Secondary | ICD-10-CM

## 2024-03-21 DIAGNOSIS — J3089 Other allergic rhinitis: Secondary | ICD-10-CM

## 2024-03-21 NOTE — Nursing Note (Signed)
 03/21/24 1300 03/21/24 1320   Allergy Injection Flowsheet   Date of Injection 03/21/24 03/21/24   Allergy Vial Identification Number [id number]   Any Reaction to Previous Administration? No No   Any Illness/ Fever in Last 24 hours?* No No   Any Change in Medication? No No   Is the patient on a beta blocker? No No   Does the patient have a valid EpiPen  at visit? Yes Yes   Does patient have a history of Asthma? No No   Expiration date 04/30/24 04/30/24   Dose 0.64ml 0.28ml   Maintenance Dose 0.9ml 0.64ml   Site Left arm Right arm   Patient tolerated procedure well yes yes   Allergy Diagnosis J30.1 - allergic rhinitis due to pollen J30.89 - allergic rhinitis due to dust mite;J30.89 - allergic rhinitis due to mold   Time given 1321 1321   Patient declined to wait 20 minutes Yes Yes   Managing Physician Ramadan Ramadan   Initials NM NM   Were the vials mailed? No No   EpiPen  Expiration date 05/26/25 05/26/25

## 2024-03-28 ENCOUNTER — Ambulatory Visit (INDEPENDENT_AMBULATORY_CARE_PROVIDER_SITE_OTHER): Payer: Self-pay

## 2024-03-28 ENCOUNTER — Other Ambulatory Visit: Payer: Self-pay

## 2024-03-28 DIAGNOSIS — J301 Allergic rhinitis due to pollen: Secondary | ICD-10-CM

## 2024-03-28 DIAGNOSIS — J3089 Other allergic rhinitis: Secondary | ICD-10-CM

## 2024-03-28 NOTE — Nursing Note (Signed)
 03/28/24 1300 03/28/24 1325   Allergy Injection Flowsheet   Date of Injection 03/28/24 03/28/24   Allergy Vial Identification Number [id number]   Any Reaction to Previous Administration? No No   Any Illness/ Fever in Last 24 hours?* No No   Any Change in Medication? No No   Is the patient on a beta blocker? No No   Does the patient have a valid EpiPen  at visit? Yes Yes   Does patient have a history of Asthma? No No   Expiration date 04/30/24 04/30/24   Dose 0.50 ml 0.50 ml   Maintenance Dose 0.50 ml 0.50 ml   Site Left arm Right arm   Patient tolerated procedure well yes yes   Allergy Diagnosis J30.1 - allergic rhinitis due to pollen J30.89 - allergic rhinitis due to dust mite;J30.89 - allergic rhinitis due to mold   Time given 1326 1326   Patient declined to wait 20 minutes Yes Yes   Managing Physician Ramadan Ramadan   Initials mk mk   Were the vials mailed? No No   EpiPen  Expiration date  --  05/26/25     Bryna Car, RN

## 2024-04-04 ENCOUNTER — Other Ambulatory Visit: Payer: Self-pay

## 2024-04-04 ENCOUNTER — Ambulatory Visit (INDEPENDENT_AMBULATORY_CARE_PROVIDER_SITE_OTHER): Payer: Self-pay

## 2024-04-04 ENCOUNTER — Ambulatory Visit (INDEPENDENT_AMBULATORY_CARE_PROVIDER_SITE_OTHER)

## 2024-04-04 DIAGNOSIS — J3089 Other allergic rhinitis: Secondary | ICD-10-CM

## 2024-04-04 DIAGNOSIS — J301 Allergic rhinitis due to pollen: Secondary | ICD-10-CM

## 2024-04-04 NOTE — Nursing Note (Signed)
 04/04/24 1400 04/04/24 1446   Mixed Vials   Comments Pt took vials with instructions to outside facility back home  --    Allergy Injection Flowsheet   Date of Injection 04/04/24 04/04/24   Allergy Vial Identification Number [id number]   Any Reaction to Previous Administration? No No   Any Illness/ Fever in Last 24 hours?* No No   Any Change in Medication? No No   Is the patient on a beta blocker? No No   Does the patient have a valid EpiPen  at visit? Yes Yes   Does patient have a history of Asthma? No No   Expiration date 04/30/24 04/30/24   Dose 0.11ml 0.20ml   Maintenance Dose 0.30ml 0.2ml   Site Left arm Right arm   Patient tolerated procedure well yes yes   Allergy Diagnosis J30.1 - allergic rhinitis due to pollen J30.89 - allergic rhinitis due to dust mite;J30.89 - allergic rhinitis due to mold   Time given 1451 1452   Patient declined to wait 20 minutes Yes Yes   Managing Physician Ramadan Ramadan   Initials af af   Were the vials mailed? No No   EpiPen  Expiration date  --  05/26/25     Lynann Sandman, LPN

## 2024-04-11 ENCOUNTER — Other Ambulatory Visit (INDEPENDENT_AMBULATORY_CARE_PROVIDER_SITE_OTHER): Payer: Self-pay | Admitting: Otolaryngology

## 2024-04-11 DIAGNOSIS — J301 Allergic rhinitis due to pollen: Secondary | ICD-10-CM

## 2024-04-11 DIAGNOSIS — Z91038 Other insect allergy status: Secondary | ICD-10-CM

## 2024-04-11 DIAGNOSIS — J3089 Other allergic rhinitis: Secondary | ICD-10-CM

## 2024-04-11 NOTE — Nursing Note (Signed)
 I attempted to call patient. No response. Message and callback number left on voicemail.

## 2024-04-14 NOTE — Nursing Note (Signed)
 I attempted to call patient to determine last dose. No response. Message and callback number left on voicemail.

## 2024-04-15 ENCOUNTER — Other Ambulatory Visit (INDEPENDENT_AMBULATORY_CARE_PROVIDER_SITE_OTHER): Payer: Self-pay | Admitting: Otolaryngology

## 2024-04-15 DIAGNOSIS — J301 Allergic rhinitis due to pollen: Secondary | ICD-10-CM

## 2024-04-15 DIAGNOSIS — J3089 Other allergic rhinitis: Secondary | ICD-10-CM

## 2024-04-15 NOTE — Nursing Note (Signed)
 Called outside clinic where pt receives injections. They will contact other office where pt receives injection to determine date of last injection. Will await call.

## 2024-04-16 ENCOUNTER — Ambulatory Visit (INDEPENDENT_AMBULATORY_CARE_PROVIDER_SITE_OTHER)

## 2024-04-16 DIAGNOSIS — J309 Allergic rhinitis, unspecified: Secondary | ICD-10-CM

## 2024-04-16 NOTE — Nursing Note (Signed)
 04/15/24 1444   ENT Immunotherapy Vial Preparation Flowsheet   Allergy Test Date 02/16/22   Immunotherapy Start Date 03/16/22   Type of Immunotherapy? Subcutaneous   Managing Physician Arie Powell   Compounding Staff Purcell Bruce   [place] #1   [place] #1 Identification Number [id number]   Preparation Date 04/15/24   Expiration Date 07/16/24   Immunotherapy Status No escalation   Diagnosis Allergic Rhinitis due to pollen (J30.1)   Ragweed, Short 1:20 W/V   Ragweed, Short Initial Endpoint 5   Dilution (10% Glycerin) Used 4   Volume of Dilution Used 0.31ml   Lot Number [id number]   Expiration Date 08/16/24   Manufacturer ALK   Pigweed, Rough/Redroot 1:20 W/V   Pigweed, Rough/Redroot Initial Endpoint 5   Dilution (10% Glycerin) Used 4   Volume of Dilution Used 0.39ml   Lot Number [id number]   Expiration Date 08/16/24   Manufacturer ALK   Plantain, English 1:20 W/V   Plantain, English Initial Endpoint 5   Dilution (10% Glycerin) Used 4   Volume of Dilution Used 0.41ml   Lot Number [id number]   Expiration Date 08/16/24   Manufacturer ALK   Lamb's Quarters 1:20 W/V   Lamb's Quarters Initial Endpoint 4   Dilution (10% Glycerin) Used 3   Volume of Dilution Used 0.13ml   Lot Number [id number]   Expiration Date 08/16/24   Manufacturer ALK   Johnson Grass 1:20 W/V   Johnson Grass Initial Endpoint 5   Dilution (10% Glycerin) Used 4   Volume of Dilution Used 0.63ml   Lot Number [id number]   Expiration Date 08/16/24   Manufacturer ALK   Red Oak 1:20 W/V   Red [place] Initial Endpoint 5   Dilution (10% Glycerin) Used 4    Volume of Dilution Used 0.11ml   Lot Number [id number]   Expiration Date 08/16/24   Manufacturer ALK   [place] 1:20 W/V   [place] Initial Endpoint 5   Dilution (10% Glycerin) Used 4    Volume of Dilution Used 0.59ml   Lot Number [id number]   Expiration Date 08/16/24   Manufacturer ALK   Amelia Jurist, Red 1:20 W/V   Amelia Jurist, Red Initial Endpoint 5   Dilution (10% Glycerin) Used 4    Volume of Dilution Used 0.30ml   Lot  Number [id number]   Expiration Date 08/16/24   Manufacturer ALK   Sycamore, American Eastern 1:20 W/V   [place], [place] Initial Endpoint 5   Dilution (10% Glycerin) Used 4   Volume of Dilution Used 0.62ml   Lot Number [id number]   Expiration Date 08/16/24   Manufacturer ALK   Other Vial #1   Antigen Volume 1.8   Sterile Diluent Normal Saline Volume 3.2   Total Volume 5ml   Sterile Diluent Normal Saline Lot Number [id number]   Sterile Diluent Normal Saline Expiration Date 05/26/26   Sterile Diluent Normal Saline Manufacturer ALK   Vial #2   Vial #2 Identification Number [id number]   Preparation Date 04/15/24   Expiration Date 07/16/24   Immunotherapy Status No escalation   Diagnosis Allergic Rhinitis due to dustmite (J30.89);Allergic Rhinitis due to mold (J30.89)   Dustmite Farinae 10,000 AU/ml   Dustmite Farinae Initial Endpoint 2    Dilution (10% Glycerin) Used 1   Volume of Dilution Used 0.20ml   Lot Number [id number]   Expiration Date 08/16/24   Manufacturer ALK   Dustmite Pteronyssinus 10,000 AU/ml   Dustmite Pteronyssinus Initial Endpoint 4  Dilution (10% Glycerin) Used 3   Volume of Dilution Used 0.20ml   Lot Number [id number]   Expiration Date 08/16/24   Manufacturer ALK   Alternaria 1:40 W/V   Alternaria Initial Endpoint 4    Dilution (10% Glycerin) Used 3   Volume of Dilution Used 0.20ml   Lot Number [id number]   Expiration Date 08/16/24   Manufacturer ALK   Bipoloris Sorokiniana 1:40 W/V   Bipoloris Sorokiniana Initial Endpoint 3   Dilution (10% Glycerin) Used 2   Volume of Dilution Used 0.20ml   Lot Number [id number]   Expiration Date 08/16/24   Manufacturer ALK   Other Vial #2   Antigen Volume 0.8   Sterile Diluent Normal Saline Volume 4.2   Total Volume 5ml   Sterile Diluent Normal Saline Lot Number [id number]   Sterile Diluent Normal Saline Expiration Date 05/26/26   Sterile Diluent Normal Saline Manufacturer ALK      2 10-unit vials mixed.

## 2024-04-25 ENCOUNTER — Telehealth: Payer: Self-pay

## 2024-04-25 ENCOUNTER — Ambulatory Visit (INDEPENDENT_AMBULATORY_CARE_PROVIDER_SITE_OTHER): Payer: Self-pay

## 2024-04-25 DIAGNOSIS — J309 Allergic rhinitis, unspecified: Secondary | ICD-10-CM

## 2024-04-25 NOTE — Telephone Encounter (Signed)
 Patient came into the office requesting vials transferred over to the [facility] office. Informed of office hours and vials will be taken to [facility] on Monday.

## 2024-05-01 ENCOUNTER — Ambulatory Visit (INDEPENDENT_AMBULATORY_CARE_PROVIDER_SITE_OTHER): Payer: Self-pay

## 2024-05-01 DIAGNOSIS — J309 Allergic rhinitis, unspecified: Secondary | ICD-10-CM | POA: Diagnosis not present

## 2024-05-08 ENCOUNTER — Ambulatory Visit (INDEPENDENT_AMBULATORY_CARE_PROVIDER_SITE_OTHER): Payer: Self-pay

## 2024-05-08 DIAGNOSIS — J309 Allergic rhinitis, unspecified: Secondary | ICD-10-CM

## 2024-05-15 ENCOUNTER — Ambulatory Visit (INDEPENDENT_AMBULATORY_CARE_PROVIDER_SITE_OTHER): Payer: Self-pay

## 2024-05-15 DIAGNOSIS — J309 Allergic rhinitis, unspecified: Secondary | ICD-10-CM | POA: Diagnosis not present

## 2024-05-22 ENCOUNTER — Ambulatory Visit

## 2024-05-22 DIAGNOSIS — J309 Allergic rhinitis, unspecified: Secondary | ICD-10-CM | POA: Diagnosis not present

## 2024-06-05 ENCOUNTER — Ambulatory Visit

## 2024-06-05 DIAGNOSIS — J309 Allergic rhinitis, unspecified: Secondary | ICD-10-CM | POA: Diagnosis not present

## 2024-06-12 ENCOUNTER — Ambulatory Visit

## 2024-06-12 DIAGNOSIS — J309 Allergic rhinitis, unspecified: Secondary | ICD-10-CM

## 2024-06-19 ENCOUNTER — Ambulatory Visit (INDEPENDENT_AMBULATORY_CARE_PROVIDER_SITE_OTHER)

## 2024-06-19 DIAGNOSIS — J309 Allergic rhinitis, unspecified: Secondary | ICD-10-CM | POA: Diagnosis not present

## 2024-06-26 ENCOUNTER — Ambulatory Visit (INDEPENDENT_AMBULATORY_CARE_PROVIDER_SITE_OTHER)

## 2024-06-26 DIAGNOSIS — J309 Allergic rhinitis, unspecified: Secondary | ICD-10-CM

## 2024-07-03 ENCOUNTER — Ambulatory Visit (INDEPENDENT_AMBULATORY_CARE_PROVIDER_SITE_OTHER)

## 2024-07-03 DIAGNOSIS — J309 Allergic rhinitis, unspecified: Secondary | ICD-10-CM

## 2024-07-04 ENCOUNTER — Ambulatory Visit

## 2024-07-04 NOTE — Progress Notes (Signed)
 Immunotherapy   Patient Details  Name: Tracy [name] MRN: [id number] Date of Birth: [birth year]  07/04/2024  Tracy [name] came into pick up silver vials. They will be taken to [facility], ENT in [place] .     Tracy [name] Shed 07/04/2024, 8:30 AM

## 2024-07-07 ENCOUNTER — Ambulatory Visit (HOSPITAL_BASED_OUTPATIENT_CLINIC_OR_DEPARTMENT_OTHER): Payer: Self-pay | Admitting: Pediatric Allergy/Immunology

## 2024-07-11 ENCOUNTER — Ambulatory Visit (INDEPENDENT_AMBULATORY_CARE_PROVIDER_SITE_OTHER): Payer: Self-pay

## 2024-07-11 ENCOUNTER — Other Ambulatory Visit: Payer: Self-pay

## 2024-07-11 DIAGNOSIS — J301 Allergic rhinitis due to pollen: Secondary | ICD-10-CM

## 2024-07-11 DIAGNOSIS — J3089 Other allergic rhinitis: Secondary | ICD-10-CM

## 2024-07-11 NOTE — Nursing Note (Signed)
 07/11/24 1300 07/11/24 1355   Mixed Vials   Comments  --  finished vials   Allergy Injection Flowsheet   Date of Injection 07/11/24 07/11/24   Allergy Vial Identification Number [id number]   Any Reaction to Previous Administration? No No   Any Illness/ Fever in Last 24 hours?* No No   Any Change in Medication? No No   Is the patient on a beta blocker? No No   Does the patient have a valid EpiPen  at visit? Yes Yes   Does patient have a history of Asthma? No No   Expiration date 07/16/24 07/16/24   Dose 0.56ml 0.53ml   Maintenance Dose 0.25ml 0.8ml   Site Left arm Right arm   Patient tolerated procedure well yes yes   Allergy Diagnosis J30.1 - allergic rhinitis due to pollen J30.89 - allergic rhinitis due to dust mite;J30.89 - allergic rhinitis due to mold   Time given 1356 1356   Patient declined to wait 20 minutes Yes Yes   Managing Physician Ramadan Ramadan   Initials ga ga   Were the vials mailed? No No   EpiPen  Expiration date  --  05/26/25     Arley Kindler, LPN

## 2024-07-11 NOTE — Nursing Note (Signed)
 LVM to discuss allergy injection visit scheduled for this afternoon. Do not see request for vials in chart, and vials are not mixed. Patient to reschedule appointment for after August 26.

## 2024-07-14 ENCOUNTER — Other Ambulatory Visit (INDEPENDENT_AMBULATORY_CARE_PROVIDER_SITE_OTHER): Payer: Self-pay | Admitting: Otolaryngology

## 2024-07-14 DIAGNOSIS — Z91038 Other insect allergy status: Secondary | ICD-10-CM

## 2024-07-14 DIAGNOSIS — J301 Allergic rhinitis due to pollen: Secondary | ICD-10-CM

## 2024-07-14 DIAGNOSIS — J3089 Other allergic rhinitis: Secondary | ICD-10-CM

## 2024-07-15 ENCOUNTER — Ambulatory Visit (INDEPENDENT_AMBULATORY_CARE_PROVIDER_SITE_OTHER): Payer: Self-pay | Admitting: Otolaryngology

## 2024-07-15 ENCOUNTER — Ambulatory Visit (INDEPENDENT_AMBULATORY_CARE_PROVIDER_SITE_OTHER): Payer: Self-pay

## 2024-07-15 ENCOUNTER — Other Ambulatory Visit: Payer: Self-pay

## 2024-07-15 ENCOUNTER — Other Ambulatory Visit (INDEPENDENT_AMBULATORY_CARE_PROVIDER_SITE_OTHER): Payer: Self-pay | Admitting: Family

## 2024-07-15 VITALS — Temp 97.8°F | Ht 72.0 in | Wt 179.0 lb

## 2024-07-15 DIAGNOSIS — J343 Hypertrophy of nasal turbinates: Secondary | ICD-10-CM

## 2024-07-15 DIAGNOSIS — J309 Allergic rhinitis, unspecified: Secondary | ICD-10-CM

## 2024-07-15 DIAGNOSIS — J301 Allergic rhinitis due to pollen: Secondary | ICD-10-CM

## 2024-07-15 DIAGNOSIS — J3089 Other allergic rhinitis: Secondary | ICD-10-CM

## 2024-07-15 NOTE — Progress Notes (Signed)
 ENT, [facility] ENT  [address] [place]  [place] [address]  [phone]    PATIENT NAME:  Tyler Navarro  MRN:  [id number]  DOB:  2001/01/13  DATE OF SERVICE: 07/15/2024    Chief Complaint:  Follow Up and Allergic Rhinitis      HPI:  Tyler Navarro is a 23 y.o. male we follow for allergic rhinitis. He did start his allergy shots on April 2023. He does have significant allergies. Escalation has been somewhat of a challenge, but he seems to be doing well, and he seems to be noticing a significant difference. We did escalate his right arm last time; he did not have any reactions.     Medications:  Outpatient Medications Marked as Taking for the 07/15/24 encounter (Office Visit) with Rachard Isidro H, MD   Medication Sig   . cetirizine (ZYRTEC) 10 mg Oral Tablet Take 1 Tablet (10 mg total) by mouth Daily   . EPINEPHrine  0.3 mg/0.3 mL Injection Auto-Injector Inject 0.3 mL (0.3 mg total) into the muscle Once, as needed for up to 2 doses   . fexofenadine (ALLEGRA) 180 mg Oral Tablet Take 1 Tablet (180 mg total) by mouth Daily   . montelukast  (SINGULAIR ) 10 mg Oral Tablet Take 1 Tablet (10 mg total) by mouth Every evening       Allergies:  Allergies   Allergen Reactions   . Grass Pollen    . House Dust    . Tree And Shrub Pollen          Physical Exam:  Temperature 36.6 C (97.8 F), temperature source Thermal Scan, height 1.829 m (6'), weight 81.2 kg (179 lb 0.2 oz).  Body mass index is 24.28 kg/m.  General Appearance: Pleasant, cooperative, healthy, and in no acute distress.  Eyes: Conjunctivae/corneas clear.  Head and Face: Face symmetric, no obvious lesions.   External auditory canals:  Patent without inflammation.  Tympanic membranes:  Intact, translucent, midposition, middle ear aerated.  Nose: No congestion noted.    Oral Cavity/Oropharynx: No mucosal lesions, masses, or pharyngeal asymmetry.    Procedure:             Data Reviewed:  Allergy test reviewed.     Assessment:      ICD-10-CM    1.  Allergic rhinitis  J30.9       2. 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 His SNOT-22 equals 14.     Norval DEL Natash Berman, MD     kll

## 2024-07-15 NOTE — Telephone Encounter (Signed)
 Note placed flowsheet.        Silvano Houston, LPN  Elizabethann Appl, RN  Appl,    Dr. Ramadan requested that we increase Vial 1 only next mix for this patient.    Thanks!  Danielle

## 2024-07-16 NOTE — Nursing Note (Signed)
 07/15/24 1553   ENT Immunotherapy Vial Preparation Flowsheet   Allergy Test Date 02/16/22   Immunotherapy Start Date 03/16/22   Type of Immunotherapy? Subcutaneous   Managing Physician Ramadan   Compounding Staff Alford Rosaline Mardy maybell Burman #1   [place] #1 Identification Number [id number]   Preparation Date 07/15/24   Expiration Date 10/15/24   Immunotherapy Status No escalation   Diagnosis Allergic Rhinitis due to pollen (J30.1)   Ragweed, Short 1:20 W/V   Ragweed, Short Initial Endpoint 5   Dilution (10% Glycerin) Used 4   Volume of Dilution Used 0.83ml   Lot Number [id number]   Expiration Date 11/15/24   Manufacturer ALK   Pigweed, Rough/Redroot 1:20 W/V   Pigweed, Rough/Redroot Initial Endpoint 5   Dilution (10% Glycerin) Used 4   Volume of Dilution Used 0.31ml   Lot Number [id number]   Expiration Date 11/15/24   Manufacturer ALK   Plantain, English 1:20 W/V   Plantain, English Initial Endpoint 5   Dilution (10% Glycerin) Used 4   Volume of Dilution Used 0.61ml   Lot Number [id number]   Expiration Date 11/15/24   Manufacturer ALK   Lamb[place] Quarters 1:20 W/V   Lamb[place]arters Initial Endpoint 4   Dilution (10% Glycerin) Used 3   Volume of Dilution Used 0.64ml   Lot Number [id number]   Expiration Date 11/15/24   Manufacturer ALK   [place] 1:20 W/V   Johnson Grass Initial Endpoint 5   Dilution (10% Glycerin) Used 4   Volume of Dilution Used 0.93ml   Lot Number [id number]   Expiration Date 11/15/24   Manufacturer ALK   Red Oak 1:20 W/V   Red Oak Initial Endpoint 5   Dilution (10% Glycerin) Used 4    Volume of Dilution Used 0.34ml   Lot Number [id number]   Expiration Date 11/15/24   Manufacturer ALK   [place] 1:20 W/V   [place] Initial Endpoint 5   Dilution (10% Glycerin) Used 4    Volume of Dilution Used 0.22ml   Lot Number [id number]   Expiration Date 11/15/24   Manufacturer ALK   Valrie, Red 1:20 W/V   Valrie, Red Initial Endpoint 5   Dilution (10% Glycerin) Used 4    Volume of Dilution Used 0.61ml    Lot Number [id number]   Expiration Date 11/15/24   Manufacturer ALK   Sycamore, American Eastern 1:20 W/V   [place], American [place] Initial Endpoint 5   Dilution (10% Glycerin) Used 4   Volume of Dilution Used 0.53ml   Lot Number [id number]   Expiration Date 11/15/24   Manufacturer ALK   Other Vial #1   Antigen Volume 1.8   Sterile Diluent Normal Saline Volume 3.2   Total Volume 5ml   Sterile Diluent Normal Saline Lot Number [id number]   Sterile Diluent Normal Saline Expiration Date 08/26/26   Sterile Diluent Normal Saline Manufacturer ALK   Vial #2   Vial #2 Identification Number [id number]   Preparation Date 07/15/24   Expiration Date 10/15/24   Immunotherapy Status Increase   Diagnosis Allergic Rhinitis due to dustmite (J30.89);Allergic Rhinitis due to mold (J30.89)   Dustmite Farinae 10,000 AU/ml   Dustmite Farinae Initial Endpoint 2    Dilution (10% Glycerin) Used Concentrate   Volume of Dilution Used 0.20ml   Lot Number [id number]   Expiration Date 05/16/26   Manufacturer ALK   Dustmite Pteronyssinus 10,000 AU/ml   Dustmite Pteronyssinus Initial Endpoint 4  Dilution (10% Glycerin) Used 2   Volume of Dilution Used 0.61ml   Lot Number [id number]   Expiration Date 11/15/24   Manufacturer ALK   Alternaria 1:40 W/V   Alternaria Initial Endpoint 4    Dilution (10% Glycerin) Used 2   Volume of Dilution Used 0.20ml   Lot Number [id number]   Expiration Date 11/15/24   Manufacturer ALK   Bipoloris Sorokiniana 1:40 W/V   Bipoloris Sorokiniana Initial Endpoint 3   Dilution (10% Glycerin) Used 1   Volume of Dilution Used 0.20ml   Lot Number [id number]   Expiration Date 11/15/24   Manufacturer ALK   Other Vial #2   Antigen Volume 0.8   Sterile Diluent Normal Saline Volume 4.2   Total Volume 5ml   Sterile Diluent Normal Saline Lot Number [id number]   Sterile Diluent Normal Saline Expiration Date 08/26/26   Sterile Diluent Normal Saline Manufacturer ALK     2 (10 unit) vials mixed.

## 2024-07-18 ENCOUNTER — Ambulatory Visit (INDEPENDENT_AMBULATORY_CARE_PROVIDER_SITE_OTHER): Payer: Self-pay

## 2024-07-18 ENCOUNTER — Other Ambulatory Visit: Payer: Self-pay

## 2024-07-18 DIAGNOSIS — J301 Allergic rhinitis due to pollen: Secondary | ICD-10-CM

## 2024-07-18 DIAGNOSIS — J3089 Other allergic rhinitis: Secondary | ICD-10-CM

## 2024-07-18 NOTE — Nursing Note (Signed)
 07/18/24 1300 07/18/24 1312   Mixed Vials   Allergy Vial #2 Date Mixed  --  07/18/24   Allergy Vial # 2 Identification Number  --  [id number]   Test Wheal Vial #2  --  wheal   Comments  --  13+mm   Comments  --  Did not receive injection, will place in basket to get diluted   Allergy Injection Flowsheet   Date of Injection 07/18/24 07/18/24   Allergy Vial Identification Number [id number]   Any Reaction to Previous Administration? No No   Any Illness/ Fever in Last 24 hours?* No No   Any Change in Medication? No No   Is the patient on a beta blocker? No No   Does the patient have a valid EpiPen  at visit? Yes Yes   Does patient have a history of Asthma? 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 No No   EpiPen  Expiration date  --  05/26/25     Annamaree Feather, LPN    I have reviewed the above allergy vial safety test, mix vial weaker.  Joesph Clubs, PA-C

## 2024-07-22 ENCOUNTER — Other Ambulatory Visit (INDEPENDENT_AMBULATORY_CARE_PROVIDER_SITE_OTHER): Payer: Self-pay

## 2024-07-25 ENCOUNTER — Ambulatory Visit (INDEPENDENT_AMBULATORY_CARE_PROVIDER_SITE_OTHER): Payer: Self-pay

## 2024-08-01 ENCOUNTER — Other Ambulatory Visit: Payer: Self-pay

## 2024-08-01 ENCOUNTER — Ambulatory Visit (INDEPENDENT_AMBULATORY_CARE_PROVIDER_SITE_OTHER): Payer: Self-pay

## 2024-08-01 DIAGNOSIS — J301 Allergic rhinitis due to pollen: Secondary | ICD-10-CM

## 2024-08-01 DIAGNOSIS — J3089 Other allergic rhinitis: Secondary | ICD-10-CM

## 2024-08-01 NOTE — Nursing Note (Signed)
 08/01/24 1300 08/01/24 1312   Allergy Injection Flowsheet   Date of Injection 08/01/24 08/01/24   Allergy Vial Identification Number [id number]   Any Reaction to Previous Administration? No No   Any Illness/ Fever in Last 24 hours?* No No   Any Change in Medication? No No   Is the patient on a beta blocker? No No   Does the patient have a valid EpiPen  at visit? Yes Yes   Does patient have a history of Asthma? 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 No No   EpiPen  Expiration date  --  05/26/25     Arley Kindler, LPN

## 2024-08-08 ENCOUNTER — Ambulatory Visit (INDEPENDENT_AMBULATORY_CARE_PROVIDER_SITE_OTHER): Payer: Self-pay

## 2024-08-22 ENCOUNTER — Ambulatory Visit (INDEPENDENT_AMBULATORY_CARE_PROVIDER_SITE_OTHER)

## 2024-08-22 ENCOUNTER — Other Ambulatory Visit: Payer: Self-pay

## 2024-08-22 DIAGNOSIS — J3089 Other allergic rhinitis: Secondary | ICD-10-CM

## 2024-08-22 DIAGNOSIS — J301 Allergic rhinitis due to pollen: Secondary | ICD-10-CM

## 2024-08-22 NOTE — Nursing Note (Signed)
 08/22/24 1500 08/22/24 1522   Mixed Vials   Comments will be back in build up schedule per leanne, decreased dose due to time.  --    Allergy Injection Flowsheet   Date of Injection 08/22/24 08/22/24   Allergy Vial Identification Number [id number]   Any Reaction to Previous Administration? No No   Any Illness/ Fever in Last 24 hours?* No No   Any Change in Medication? No No   Is the patient on a beta blocker? No No   Does the patient have a valid EpiPen  at visit? Yes Yes   Does patient have a history of Asthma? 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 No No   EpiPen  Expiration date  --  05/26/25     Caren Hint, Ambulatory Care Assistant

## 2024-08-29 ENCOUNTER — Ambulatory Visit (INDEPENDENT_AMBULATORY_CARE_PROVIDER_SITE_OTHER): Payer: Self-pay

## 2024-08-29 ENCOUNTER — Other Ambulatory Visit: Payer: Self-pay

## 2024-08-29 DIAGNOSIS — J301 Allergic rhinitis due to pollen: Secondary | ICD-10-CM

## 2024-08-29 DIAGNOSIS — J3089 Other allergic rhinitis: Secondary | ICD-10-CM

## 2024-08-29 NOTE — Nursing Note (Signed)
 08/29/24 1400 08/29/24 1417   Mixed Vials   Comments Per Leanne, vial 1 will be in build up schedule  --    Allergy Injection Flowsheet   Date of Injection 08/29/24 08/29/24   Allergy Vial Identification Number [id number]   Any Reaction to Previous Administration? No No   Any Illness/ Fever in Last 24 hours?* No No   Any Change in Medication? No No   Is the patient on a beta blocker? No No   Does the patient have a valid EpiPen  at visit? Yes Yes   Does patient have a history of Asthma? 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 No No   EpiPen  Expiration date  --  05/26/25     Adam Hash, LPN

## 2024-09-05 ENCOUNTER — Ambulatory Visit (INDEPENDENT_AMBULATORY_CARE_PROVIDER_SITE_OTHER): Payer: Self-pay

## 2024-09-05 ENCOUNTER — Other Ambulatory Visit: Payer: Self-pay

## 2024-09-05 DIAGNOSIS — J301 Allergic rhinitis due to pollen: Secondary | ICD-10-CM

## 2024-09-05 DIAGNOSIS — J3089 Other allergic rhinitis: Secondary | ICD-10-CM

## 2024-09-05 NOTE — Nursing Note (Signed)
 09/05/24 1400 09/05/24 1502   Allergy Injection Flowsheet   Date of Injection 09/05/24 09/05/24   Allergy Vial Identification Number [id number]   Any Reaction to Previous Administration? No No   Any Illness/ Fever in Last 24 hours?* No No   Any Change in Medication? No No   Is the patient on a beta blocker? No No   Does the patient have a valid EpiPen  at visit? Yes Yes   Does patient have a history of Asthma? 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 No No   EpiPen  Expiration date  --  05/26/25

## 2024-09-12 ENCOUNTER — Other Ambulatory Visit: Payer: Self-pay

## 2024-09-12 ENCOUNTER — Ambulatory Visit (INDEPENDENT_AMBULATORY_CARE_PROVIDER_SITE_OTHER): Payer: Self-pay

## 2024-09-12 DIAGNOSIS — J301 Allergic rhinitis due to pollen: Secondary | ICD-10-CM

## 2024-09-12 DIAGNOSIS — J3089 Other allergic rhinitis: Secondary | ICD-10-CM

## 2024-09-12 NOTE — Nursing Note (Signed)
 09/12/24 1500 09/12/24 1506   Allergy Injection Flowsheet   Date of Injection 09/12/24 09/12/24   Allergy Vial Identification Number [id number]   Any Reaction to Previous Administration? No No   Any Illness/ Fever in Last 24 hours?* No No   Any Change in Medication? No No   Is the patient on a beta blocker? No No   Does the patient have a valid EpiPen  at visit? Yes Yes   Does patient have a history of Asthma? 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 No No   EpiPen  Expiration date  --  05/26/25     Adam Hash, LPN

## 2024-09-19 ENCOUNTER — Ambulatory Visit (INDEPENDENT_AMBULATORY_CARE_PROVIDER_SITE_OTHER): Payer: Self-pay

## 2024-10-01 ENCOUNTER — Ambulatory Visit (INDEPENDENT_AMBULATORY_CARE_PROVIDER_SITE_OTHER)

## 2024-10-01 ENCOUNTER — Other Ambulatory Visit: Payer: Self-pay

## 2024-10-01 DIAGNOSIS — J3089 Other allergic rhinitis: Secondary | ICD-10-CM

## 2024-10-01 DIAGNOSIS — J301 Allergic rhinitis due to pollen: Secondary | ICD-10-CM

## 2024-10-01 NOTE — Nursing Note (Signed)
 10/01/24 1300 10/01/24 1310   Mixed Vials   Comments Decrease due to time  --    Allergy Injection Flowsheet   Date of Injection 10/01/24 10/01/24   Allergy Vial Identification Number [id number]   Any Reaction to Previous Administration? No No   Any Illness/ Fever in Last 24 hours?* No No   Any Change in Medication? No No   Is the patient on a beta blocker? No No   Does the patient have a valid EpiPen  at visit? Yes Yes   Does patient have a history of Asthma? 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 No No   EpiPen  Expiration date  --  05/26/25     Adam Hash, LPN

## 2024-10-08 ENCOUNTER — Other Ambulatory Visit: Payer: Self-pay

## 2024-10-08 ENCOUNTER — Ambulatory Visit (INDEPENDENT_AMBULATORY_CARE_PROVIDER_SITE_OTHER): Payer: Self-pay

## 2024-10-08 DIAGNOSIS — J3089 Other allergic rhinitis: Secondary | ICD-10-CM

## 2024-10-08 DIAGNOSIS — J301 Allergic rhinitis due to pollen: Secondary | ICD-10-CM

## 2024-10-08 NOTE — Nursing Note (Signed)
 10/08/24 1300 10/08/24 1325   Allergy Injection Flowsheet   Date of Injection 10/08/24 10/08/24   Allergy Vial Identification Number [id number]   Any Reaction to Previous Administration? No No   Any Illness/ Fever in Last 24 hours?* No No   Any Change in Medication? No No   Is the patient on a beta blocker? No No   Does the patient have a valid EpiPen  at visit? Yes Yes   Does patient have a history of Asthma? 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 No No   EpiPen  Expiration date 05/26/25  --      Wyn Search, RN

## 2024-10-13 ENCOUNTER — Other Ambulatory Visit (INDEPENDENT_AMBULATORY_CARE_PROVIDER_SITE_OTHER): Payer: Self-pay | Admitting: Otolaryngology

## 2024-10-13 DIAGNOSIS — J3089 Other allergic rhinitis: Secondary | ICD-10-CM

## 2024-10-13 DIAGNOSIS — Z91038 Other insect allergy status: Secondary | ICD-10-CM

## 2024-10-13 DIAGNOSIS — J301 Allergic rhinitis due to pollen: Secondary | ICD-10-CM

## 2024-10-14 ENCOUNTER — Other Ambulatory Visit (INDEPENDENT_AMBULATORY_CARE_PROVIDER_SITE_OTHER): Payer: Self-pay | Admitting: Family

## 2024-10-14 DIAGNOSIS — J3089 Other allergic rhinitis: Secondary | ICD-10-CM

## 2024-10-14 DIAGNOSIS — J301 Allergic rhinitis due to pollen: Secondary | ICD-10-CM

## 2024-10-15 ENCOUNTER — Ambulatory Visit (INDEPENDENT_AMBULATORY_CARE_PROVIDER_SITE_OTHER): Payer: Self-pay

## 2024-10-15 NOTE — Nursing Note (Signed)
 10/14/24 1418   ENT Immunotherapy Vial Preparation Flowsheet   Allergy Test Date 02/16/22   Immunotherapy Start Date 03/16/22   Type of Immunotherapy? Subcutaneous   Managing Physician Ramadan   Compounding Staff Elizabethann Damien Mardy billi [place] #1   [place] #1 Identification Number [id number]   Preparation Date 10/14/24   Expiration Date 01/14/25   Immunotherapy Status No escalation   Diagnosis Allergic Rhinitis due to pollen (J30.1)   Ragweed, Short 1:20 W/V   Ragweed, Short Initial Endpoint 5   Dilution (10% Glycerin) Used 4   Volume of Dilution Used 0.17ml   Lot Number [id number]   Expiration Date 02/15/25   Manufacturer ALK   Pigweed, Rough/Redroot 1:20 W/V   Pigweed, Rough/Redroot Initial Endpoint 5   Dilution (10% Glycerin) Used 4   Volume of Dilution Used 0.8ml   Lot Number [id number]   Expiration Date 02/15/25   Manufacturer ALK   Plantain, English 1:20 W/V   Plantain, English Initial Endpoint 5   Dilution (10% Glycerin) Used 4   Volume of Dilution Used 0.37ml   Lot Number [id number]   Expiration Date 02/15/25   Manufacturer ALK   Lamb's Quarters 1:20 W/V   Lamb's Quarters Initial Endpoint 4   Dilution (10% Glycerin) Used 3   Volume of Dilution Used 0.54ml   Lot Number [id number]   Expiration Date 02/15/25   Manufacturer ALK   Johnson Grass 1:20 W/V   Johnson Grass Initial Endpoint 5   Dilution (10% Glycerin) Used 4   Volume of Dilution Used 0.65ml   Lot Number [id number]   Expiration Date 02/15/25   Manufacturer ALK   Red Oak 1:20 W/V   Red Oak Initial Endpoint 5   Dilution (10% Glycerin) Used 4    Volume of Dilution Used 0.31ml   Lot Number [id number]   Expiration Date 02/15/25   Manufacturer ALK   Box Elder 1:20 W/V   [place] Initial Endpoint 5   Dilution (10% Glycerin) Used 4    Volume of Dilution Used 0.8ml   Lot Number [id number]   Expiration Date 02/15/25   Manufacturer ALK   Valrie, Red 1:20 W/V   Valrie, Red Initial Endpoint 5   Dilution (10% Glycerin) Used 4    Volume of Dilution Used 0.38ml   Lot  Number [id number]   Expiration Date 02/15/25   Manufacturer ALK   Sycamore, American Eastern 1:20 W/V   [place], [employer] [place] Initial Endpoint 5   Dilution (10% Glycerin) Used 4   Volume of Dilution Used 0.56ml   Lot Number [id number]   Expiration Date 02/15/25   Manufacturer ALK   Other Vial #1   Antigen Volume 1.8   Sterile Diluent Normal Saline Volume 3.2   Total Volume 5ml   Sterile Diluent Normal Saline Lot Number [id number]   Sterile Diluent Normal Saline Expiration Date 08/26/26   Sterile Diluent Normal Saline Manufacturer ALK   [place] HURTIS [place]man #2 Identification Number [id number]   Preparation Date 10/14/24   Expiration Date 01/14/25   Immunotherapy Status No escalation   Diagnosis Allergic Rhinitis due to dustmite (J30.89);Allergic Rhinitis due to mold (J30.89)   Dustmite Farinae 10,000 AU/ml   Dustmite Farinae Initial Endpoint 2    Dilution (10% Glycerin) Used Concentrate   Volume of Dilution Used 0.20ml   Lot Number [id number]   Expiration Date 02/07/26   Manufacturer ALK   Dustmite Pteronyssinus 10,000 AU/ml   Dustmite Pteronyssinus Initial Endpoint 4  Dilution (10% Glycerin) Used 2   Volume of Dilution Used 0.57ml   Lot Number [id number]   Expiration Date 02/15/25   Manufacturer ALK   Alternaria 1:40 W/V   Alternaria Initial Endpoint 4    Dilution (10% Glycerin) Used 2   Volume of Dilution Used 0.20ml   Lot Number [id number]   Expiration Date 02/15/25   Manufacturer ALK   Bipoloris Sorokiniana 1:40 W/V   Bipoloris [place] Initial Endpoint 3   Dilution (10% Glycerin) Used 1   Volume of Dilution Used 0.61ml   Lot Number [id number]   Expiration Date 02/15/25   Manufacturer ALK   Other Vial #2   Antigen Volume 0.8   Sterile Diluent Normal Saline Volume 4.2   Total Volume 5ml   Sterile Diluent Normal Saline Lot Number [id number]   Sterile Diluent Normal Saline Expiration Date 08/26/26   Sterile Diluent Normal Saline Manufacturer ALK   2 (10 unit) vials mixed.

## 2024-10-28 ENCOUNTER — Encounter (INDEPENDENT_AMBULATORY_CARE_PROVIDER_SITE_OTHER): Payer: Self-pay | Admitting: Otolaryngology

## 2024-10-31 ENCOUNTER — Other Ambulatory Visit: Payer: Self-pay

## 2024-10-31 ENCOUNTER — Ambulatory Visit (INDEPENDENT_AMBULATORY_CARE_PROVIDER_SITE_OTHER)

## 2024-10-31 DIAGNOSIS — J301 Allergic rhinitis due to pollen: Secondary | ICD-10-CM

## 2024-10-31 DIAGNOSIS — J3089 Other allergic rhinitis: Secondary | ICD-10-CM

## 2024-10-31 NOTE — Nursing Note (Signed)
 10/31/24 1500 10/31/24 1600   Mixed Vials   Allergy Vial #1 Date Mixed 10/31/24  --    Allergy Vial # 1 Identification Number [id number]  --    Test Wheal Vial #1 wheal  --    Comments 7mm  --    Allergy Vial #2 Date Mixed  --  10/31/24   Allergy Vial # 2 Identification Number  --  [id number]   Test Wheal Vial #2  --  wheal   Comments  --  11mm   Comments NAV. Per Donny Agreste do vial test for both vials ok to proceed with injections   Allergy Injection Flowsheet   Date of Injection 10/31/24 10/31/24   Allergy Vial Identification Number [id number]   Any Reaction to Previous Administration? No No   Any Illness/ Fever in Last 24 hours?* No No   Any Change in Medication? No No   Is the patient on a beta blocker? No No   Does the patient have a valid EpiPen  at visit? Yes Yes   Does patient have a history of Asthma? 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 No No   EpiPen  Expiration date 05/26/25  --      I have reviewed the above allergy vial safety test, give injection.  Redell Layman Re, MD

## 2024-11-07 ENCOUNTER — Ambulatory Visit (INDEPENDENT_AMBULATORY_CARE_PROVIDER_SITE_OTHER): Payer: Self-pay

## 2024-11-14 ENCOUNTER — Other Ambulatory Visit: Payer: Self-pay

## 2024-11-14 ENCOUNTER — Ambulatory Visit (INDEPENDENT_AMBULATORY_CARE_PROVIDER_SITE_OTHER)

## 2024-11-14 DIAGNOSIS — J301 Allergic rhinitis due to pollen: Secondary | ICD-10-CM

## 2024-11-14 DIAGNOSIS — J3089 Other allergic rhinitis: Secondary | ICD-10-CM

## 2024-11-14 NOTE — Nursing Note (Signed)
 11/14/24 1300 11/14/24 1340   Mixed Vials   Comments repeated dosage due to time & vials sent with pt to be injected at their dr's office back home  --    Allergy Injection Flowsheet   Date of Injection 11/14/24 11/14/24   Allergy Vial Identification Number [id number]   Any Reaction to Previous Administration? No No   Any Illness/ Fever in Last 24 hours?* No No   Any Change in Medication? No No   Is the patient on a beta blocker? No No   Does the patient have a valid EpiPen  at visit? Yes Yes   Does patient have a history of Asthma? 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 No No   EpiPen  Expiration date 05/26/25  --

## 2024-12-04 ENCOUNTER — Ambulatory Visit (INDEPENDENT_AMBULATORY_CARE_PROVIDER_SITE_OTHER)

## 2024-12-04 DIAGNOSIS — J302 Other seasonal allergic rhinitis: Secondary | ICD-10-CM

## 2024-12-12 ENCOUNTER — Ambulatory Visit (INDEPENDENT_AMBULATORY_CARE_PROVIDER_SITE_OTHER): Payer: Self-pay

## 2024-12-30 ENCOUNTER — Other Ambulatory Visit (INDEPENDENT_AMBULATORY_CARE_PROVIDER_SITE_OTHER): Admitting: Family

## 2024-12-30 DIAGNOSIS — J301 Allergic rhinitis due to pollen: Secondary | ICD-10-CM

## 2024-12-30 DIAGNOSIS — J3089 Other allergic rhinitis: Secondary | ICD-10-CM

## 2024-12-31 ENCOUNTER — Other Ambulatory Visit: Payer: Self-pay

## 2024-12-31 ENCOUNTER — Ambulatory Visit (INDEPENDENT_AMBULATORY_CARE_PROVIDER_SITE_OTHER): Payer: Self-pay

## 2024-12-31 ENCOUNTER — Other Ambulatory Visit (INDEPENDENT_AMBULATORY_CARE_PROVIDER_SITE_OTHER): Payer: Self-pay

## 2024-12-31 DIAGNOSIS — J3089 Other allergic rhinitis: Secondary | ICD-10-CM

## 2024-12-31 DIAGNOSIS — J301 Allergic rhinitis due to pollen: Secondary | ICD-10-CM

## 2024-12-31 NOTE — Nursing Note (Signed)
 12/31/24 1300 12/31/24 1337   Mixed Vials   Comments NAV  --    Allergy Injection Flowsheet   Date of Injection 12/31/24 12/31/24   Allergy Vial Identification Number [id number]   Any Reaction to Previous Administration? No No   Any Illness/ Fever in Last 24 hours?* No No   Any Change in Medication? No No   Is the patient on a beta blocker? No No   Does the patient have a valid EpiPen  at visit? Yes Yes   Does patient have a history of Asthma? 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 No No   EpiPen  Expiration date 05/26/25 05/26/25

## 2025-01-01 NOTE — Nursing Note (Signed)
 12/30/24 1422   ENT Immunotherapy Vial Preparation Flowsheet   Allergy Test Date 02/16/22   Immunotherapy Start Date 03/16/22   Type of Immunotherapy? Subcutaneous   Managing Physician Ramadan   Compounding Staff Joesph Ronco   [place] #1   [place] #1 Identification Number [id number]   Preparation Date 12/30/24   Expiration Date 03/29/25   Immunotherapy Status No escalation   Diagnosis Allergic Rhinitis due to pollen (J30.1)   Ragweed, Short 1:20 W/V   Ragweed, Short Initial Endpoint 5   Dilution (10% Glycerin) Used 4   Volume of Dilution Used 0.55ml   Lot Number [id number]   Expiration Date 05/14/25   Manufacturer ALK   Pigweed, Rough/Redroot 1:20 W/V   Pigweed, Rough/Redroot Initial Endpoint 5   Dilution (10% Glycerin) Used 4   Volume of Dilution Used 0.26ml   Lot Number [id number]   Expiration Date 05/14/25   Manufacturer ALK   Plantain, English 1:20 W/V   Plantain, English Initial Endpoint 5   Dilution (10% Glycerin) Used 4   Volume of Dilution Used 0.43ml   Lot Number [id number]   Expiration Date 05/14/25   Manufacturer ALK   [place] 1:20 W/V   [place] Initial Endpoint 4   Dilution (10% Glycerin) Used 3   Volume of Dilution Used 0.50ml   Lot Number [id number]   Expiration Date 05/14/25   Manufacturer ALK   [place] 1:20 W/V   Johnson Grass Initial Endpoint 5   Dilution (10% Glycerin) Used 4   Volume of Dilution Used 0.32ml   Lot Number [id number]   Expiration Date 05/14/25   Manufacturer ALK   [place] 1:20 W/V   [place] Initial Endpoint 5   Dilution (10% Glycerin) Used 4    Volume of Dilution Used 0.71ml   Lot Number [id number]   Expiration Date 05/14/25   Manufacturer ALK   [place] 1:20 W/V   [place] Initial Endpoint 5   Dilution (10% Glycerin) Used 4    Volume of Dilution Used 0.61ml   Lot Number [id number]   Expiration Date 05/14/25   Manufacturer ALK   Val[place], Red 1:20 W/V   Val[place], Red Initial Endpoint 5   Dilution (10% Glycerin) Used 4    Volume of Dilution Used 0.78ml    Lot Number [id number]   Expiration Date 05/14/25   Manufacturer ALK   [place], [place] 1:20 W/V   [place], [place] Initial Endpoint 5   Dilution (10% Glycerin) Used 4   Volume of Dilution Used 0.62ml   Lot Number [id number]   Expiration Date 05/14/25   Manufacturer ALK   Other Vial #1   Antigen Volume 1.8   Sterile Diluent Normal Saline Volume 3.2   Total Volume 5ml   Sterile Diluent Normal Saline Lot Number [id number]   Sterile Diluent Normal Saline Expiration Date 08/26/26   Sterile Diluent Normal Saline Manufacturer ALK   [place] HURTIS [place] #2 Identification Number [id number]   Preparation Date 12/30/24   Expiration Date 03/29/25   Immunotherapy Status No escalation   Diagnosis Allergic Rhinitis due to dustmite (J30.89);Allergic Rhinitis due to mold (J30.89)   Dustmite Farinae 10,000 AU/ml   Dustmite Farinae Initial Endpoint 2    Dilution (10% Glycerin) Used Concentrate   Volume of Dilution Used 0.20ml   Lot Number [id number]   Expiration Date 02/14/26   Manufacturer ALK   Dustmite Pteronyssinus 10,000 AU/ml   Dustmite Pteronyssinus Initial Endpoint 4  Dilution (10% Glycerin) Used 2   Volume of Dilution Used 0.50ml   Lot Number [id number]   Expiration Date 05/14/25   Manufacturer ALK   Alternaria 1:40 W/V   Alternaria Initial Endpoint 4    Dilution (10% Glycerin) Used 2   Volume of Dilution Used 0.20ml   Lot Number [id number]   Expiration Date 05/14/25   Manufacturer ALK   Bipoloris [place] 1:40 W/V   Bipoloris [place] Initial Endpoint 3   Dilution (10% Glycerin) Used 1   Volume of Dilution Used 0.20ml   Lot Number [id number]   Expiration Date 05/14/25   Manufacturer ALK   Other Vial #2   Antigen Volume 0.8   Sterile Diluent Normal Saline Volume 4.2   Total Volume 5ml   Sterile Diluent Normal Saline Lot Number [id number]   Sterile Diluent Normal Saline Expiration Date 08/26/26   Sterile Diluent Normal Saline Manufacturer ALK   2 (10 unit) vials mixed.

## 2025-01-07 ENCOUNTER — Ambulatory Visit (INDEPENDENT_AMBULATORY_CARE_PROVIDER_SITE_OTHER): Payer: Self-pay
